# Patient Record
Sex: Female | Born: 1940 | Race: White | Hispanic: No | Marital: Married | Smoking: Never smoker
Health system: Southern US, Community
[De-identification: ages and names within clinical notes are randomized; demographics above are authoritative.]

## PROBLEM LIST (undated history)

## (undated) DIAGNOSIS — F410 Panic disorder [episodic paroxysmal anxiety] without agoraphobia: Secondary | ICD-10-CM

## (undated) DIAGNOSIS — Z87442 Personal history of urinary calculi: Secondary | ICD-10-CM

## (undated) DIAGNOSIS — F32A Depression, unspecified: Secondary | ICD-10-CM

## (undated) DIAGNOSIS — J40 Bronchitis, not specified as acute or chronic: Secondary | ICD-10-CM

## (undated) DIAGNOSIS — F329 Major depressive disorder, single episode, unspecified: Secondary | ICD-10-CM

## (undated) DIAGNOSIS — R112 Nausea with vomiting, unspecified: Secondary | ICD-10-CM

## (undated) DIAGNOSIS — M51369 Other intervertebral disc degeneration, lumbar region without mention of lumbar back pain or lower extremity pain: Secondary | ICD-10-CM

## (undated) DIAGNOSIS — R0600 Dyspnea, unspecified: Secondary | ICD-10-CM

## (undated) DIAGNOSIS — M797 Fibromyalgia: Secondary | ICD-10-CM

## (undated) DIAGNOSIS — D751 Secondary polycythemia: Secondary | ICD-10-CM

## (undated) DIAGNOSIS — M1990 Unspecified osteoarthritis, unspecified site: Secondary | ICD-10-CM

## (undated) DIAGNOSIS — I10 Essential (primary) hypertension: Secondary | ICD-10-CM

## (undated) DIAGNOSIS — Z9889 Other specified postprocedural states: Secondary | ICD-10-CM

## (undated) DIAGNOSIS — Z8489 Family history of other specified conditions: Secondary | ICD-10-CM

## (undated) DIAGNOSIS — T4145XA Adverse effect of unspecified anesthetic, initial encounter: Secondary | ICD-10-CM

## (undated) DIAGNOSIS — J449 Chronic obstructive pulmonary disease, unspecified: Secondary | ICD-10-CM

## (undated) DIAGNOSIS — J189 Pneumonia, unspecified organism: Secondary | ICD-10-CM

## (undated) DIAGNOSIS — M5136 Other intervertebral disc degeneration, lumbar region: Secondary | ICD-10-CM

## (undated) DIAGNOSIS — M4800 Spinal stenosis, site unspecified: Secondary | ICD-10-CM

## (undated) DIAGNOSIS — T8859XA Other complications of anesthesia, initial encounter: Secondary | ICD-10-CM

## (undated) DIAGNOSIS — E559 Vitamin D deficiency, unspecified: Secondary | ICD-10-CM

## (undated) HISTORY — PX: COLONOSCOPY: SHX174

## (undated) HISTORY — PX: APPENDECTOMY: SHX54

## (undated) HISTORY — PX: MULTIPLE TOOTH EXTRACTIONS: SHX2053

---

## 1973-06-18 HISTORY — PX: LUMBAR FUSION: SHX111

## 1976-06-18 HISTORY — PX: ABDOMINAL HYSTERECTOMY: SHX81

## 1978-06-18 HISTORY — PX: KNEE SURGERY: SHX244

## 2001-06-18 HISTORY — PX: ANKLE FRACTURE SURGERY: SHX122

## 2011-11-19 DIAGNOSIS — D751 Secondary polycythemia: Secondary | ICD-10-CM

## 2011-11-19 HISTORY — DX: Secondary polycythemia: D75.1

## 2013-04-14 ENCOUNTER — Other Ambulatory Visit: Payer: Self-pay | Admitting: Orthopedic Surgery

## 2013-04-14 DIAGNOSIS — M545 Low back pain, unspecified: Secondary | ICD-10-CM

## 2013-04-25 ENCOUNTER — Ambulatory Visit
Admission: RE | Admit: 2013-04-25 | Discharge: 2013-04-25 | Disposition: A | Discharge status: Home / Self Care | Payer: Medicare Other | Source: Ambulatory Visit | Attending: Orthopedic Surgery | Admitting: Orthopedic Surgery

## 2013-04-25 DIAGNOSIS — M545 Low back pain: Secondary | ICD-10-CM

## 2018-06-04 ENCOUNTER — Other Ambulatory Visit: Payer: Self-pay | Admitting: Neurosurgery

## 2018-06-20 ENCOUNTER — Other Ambulatory Visit: Payer: Self-pay | Admitting: Neurosurgery

## 2018-06-23 ENCOUNTER — Other Ambulatory Visit: Payer: Self-pay

## 2018-06-23 ENCOUNTER — Encounter (HOSPITAL_COMMUNITY): Payer: Self-pay | Admitting: *Deleted

## 2018-06-23 NOTE — Progress Notes (Signed)
PCP: Dr Arma Heading  Ms Held denies chest pain, she does experience shortness of breath at times, "COPD".  Patient states that she has allergies and feels that her head is stopped up, no fever, no sinus drainage and she is not taking mediations for it.   Ms Eickholt reports that she was [facility] first week of November with a UTI, "It was a super bug".  Patient does not remember which one. "I don't want to go to surgery with an infection, I hope they test me."  Ms Gelin states she does not have any symptoms of a UTI, "but they can crept up on me."  I explained to Ms Ragin if she comes in and is tested the am of surgery  That all the results may not be back before surgery.  I suggested that she go to her PCP and have urine text prior to Wednesday.  I called Dr. Fredrich Birks office and left a voice message for [place], [place]; I reported patient's concerns and what I suggested that she (patient ) do.  Ms Savoia does not see a cardiologist, she states that she had lots of cardiac test when she was hospitalized in November, in [facility].  I requested records. I also requested records from Dr. Prentice Docker office.

## 2018-06-24 ENCOUNTER — Encounter (HOSPITAL_COMMUNITY): Payer: Self-pay

## 2018-06-24 NOTE — Progress Notes (Signed)
Anesthesia Chart Review: SAME DAY WORKUP   Case:  [id number] Date/Time:  06/25/18 1240   Procedure:  LAMINECTOMY AND FORAMINOTOMY LUMBAR 2- LUMBAR 3, LUMBAR 3- LUMBAR 4 (N/A ) - LAMINECTOMY AND FORAMINOTOMY LUMBAR 2- LUMBAR 3, LUMBAR 3- LUMBAR 4   Anesthesia type:  General   Pre-op diagnosis:  SPINAL STENOSIS, LUMBAR GREGION WITH NEUROGENIC CLAUDICATION   Location:  [facility] OR ROOM 19 / [facility] OR   Surgeon:  Tressie StalkerJenkins, Jeffrey, MD      DISCUSSION: 78 yo female never smoker. Pertinent hx includes PONV, COPD, HTN, "Slow to wake up" from anesthesia, Chronic bronchitis.  Pt reported [place] [facility] [place] [facility] [place] [facility] [place] and had several cardiac tests while admitted, although she denies any cardiac history and does not see a cardiologist. Records requested and reviewed.  EKG 05/03/2018 showed sinus rhythm.  Rate 74.  Occasional PVCs.  Right bundle branch block.  Left anterior fascicular block.  Lateral infarct, age undetermined.  Echo 04/22/2018 showed EF 70 to 75%.  Moderate concentric hypertrophy with small cavity size.  LV filling pattern consistent with diastolic dysfunction.  Normal RV size and function.  Mild aortic stenosis.  She also had a Cardiolite stress test in 2016 for evaluation of heart murmur and hypertension.  The test showed normal left ventricular systolic function with an EF of 73%.  No clear definite scintigraphic evidence of ischemia or scar.  Anticipate she can proceed as planned barring acute status change.   VS: There were no vitals taken for this visit.  PROVIDERS: Arma HeadingIsernia, , MD is PCP   LABS: Will need DOS labs.  Labs Reviewed - No data to display   EKG: 05/03/2018 (outside record, copy on pt chart): sinus rhythm.  Rate 74.  Occasional PVCs.  Right bundle branch block.  Left anterior fascicular block.  Lateral infarct, age undetermined.  CV: TTE 04/22/2018 (outside record, copy on pt chart): Summary: Moderate concentric  hypertrophy with small cavity size and hyperdynamic function.  Ejection fraction 7075%.  Left ventricular filling pattern consistent with diastolic dysfunction.  Normal right ventricular size with normal function.  There is mild aortic stenosis   Mean gradient 10 mmHg.  Cardiolite stress 04/27/2015: Impression: 1.  Normal left ventricular systolic function with an ejection fraction 73%. 2.  No clear definite scintigraphic evidence of ischemia or scar.  Past Medical History:  Diagnosis Date  . Bronchitis    chronic  . Complication of anesthesia    "very slow to wake up."  . COPD (chronic obstructive pulmonary disease) (HCC)   . DDD (degenerative disc disease), lumbar   . Depression   . Dyspnea   . 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  . Family history of adverse reaction to anesthesia    Daughter to wake up, nausea  . Fibromyalgia   . History of kidney stones    passed  . Hypertension   . Osteoarthritis   . Panic attacks   . Pneumonia   . PONV (postoperative nausea and vomiting)   . Spinal stenosis   . Vitamin D deficiency     Past Surgical History:  Procedure Laterality Date  . ABDOMINAL HYSTERECTOMY  1978  . ANKLE FRACTURE SURGERY Left 2003  . APPENDECTOMY    . KNEE SURGERY Left 1980  . LUMBAR FUSION  1975    MEDICATIONS: . albuterol (PROVENTIL HFA;VENTOLIN HFA) 108 (90 Base) MCG/ACT inhaler  . ALPRAZolam (  XANAX) 0.5 MG tablet  . Cholecalciferol (VITAMIN D3) 1.25 MG (50000 UT) CAPS  . diltiazem (DILACOR XR) 180 MG 24 hr capsule  . FLUoxetine (PROZAC) 20 MG capsule  . polyethylene glycol (MIRALAX / GLYCOLAX) packet  . potassium chloride SA (K-DUR,KLOR-CON) 20 MEQ tablet  . senna-docusate (SENNA-PLUS) 8.6-50 MG tablet   No current facility-administered medications for this encounter.     Zannie Cove [facility] Short Stay Center/Anesthesiology Phone [phone] 06/24/2018 11:25 AM

## 2018-06-24 NOTE — Anesthesia Preprocedure Evaluation (Addendum)
Anesthesia Evaluation  Patient identified by MRN, date of birth, ID band Patient awake    Reviewed: Allergy & Precautions, NPO status , Patient's Chart, lab work & pertinent test results  History of Anesthesia Complications (+) PONV  Airway Mallampati: I  TM Distance: >3 FB Neck ROM: Full    Dental   Pulmonary    Pulmonary exam normal        Cardiovascular hypertension, Pt. on medications Normal cardiovascular exam     Neuro/Psych Anxiety Depression    GI/Hepatic   Endo/Other    Renal/GU      Musculoskeletal   Abdominal   Peds  Hematology   Anesthesia Other Findings   Reproductive/Obstetrics                            Anesthesia Physical Anesthesia Plan  ASA: II  Anesthesia Plan: General   Post-op Pain Management:    Induction:   PONV Risk Score and Plan: 3 and Midazolam, Ondansetron and Treatment may vary due to age or medical condition  Airway Management Planned: Oral ETT  Additional Equipment:   Intra-op Plan:   Post-operative Plan: Extubation in OR  Informed Consent: I have reviewed the patients History and Physical, chart, labs and discussed the procedure including the risks, benefits and alternatives for the proposed anesthesia with the patient or authorized representative who has indicated his/her understanding and acceptance.     Plan Discussed with: CRNA and Surgeon  Anesthesia Plan Comments: (See PAT note by Antionette Poles, PA-C )       Anesthesia Quick Evaluation

## 2018-06-25 ENCOUNTER — Encounter (HOSPITAL_COMMUNITY)
Admission: RE | Disposition: A | Discharge status: Home / Self Care | Payer: Self-pay | Source: Home / Self Care | Attending: Neurosurgery

## 2018-06-25 ENCOUNTER — Ambulatory Visit (HOSPITAL_COMMUNITY)
Admission: RE | Admit: 2018-06-25 | Discharge: 2018-06-27 | Disposition: A | Discharge status: Home / Self Care | Payer: Medicare Other | Attending: Neurosurgery | Admitting: Neurosurgery

## 2018-06-25 ENCOUNTER — Encounter (HOSPITAL_COMMUNITY): Payer: Self-pay

## 2018-06-25 ENCOUNTER — Other Ambulatory Visit: Payer: Self-pay

## 2018-06-25 ENCOUNTER — Ambulatory Visit (HOSPITAL_COMMUNITY): Payer: Medicare Other | Admitting: Certified Registered Nurse Anesthetist

## 2018-06-25 ENCOUNTER — Ambulatory Visit (HOSPITAL_COMMUNITY): Payer: Medicare Other

## 2018-06-25 ENCOUNTER — Ambulatory Visit (HOSPITAL_COMMUNITY): Payer: Medicare Other | Admitting: Physician Assistant

## 2018-06-25 DIAGNOSIS — Z90710 Acquired absence of both cervix and uterus: Secondary | ICD-10-CM | POA: Insufficient documentation

## 2018-06-25 DIAGNOSIS — Z888 Allergy status to other drugs, medicaments and biological substances status: Secondary | ICD-10-CM | POA: Insufficient documentation

## 2018-06-25 DIAGNOSIS — E559 Vitamin D deficiency, unspecified: Secondary | ICD-10-CM | POA: Diagnosis not present

## 2018-06-25 DIAGNOSIS — M1990 Unspecified osteoarthritis, unspecified site: Secondary | ICD-10-CM | POA: Insufficient documentation

## 2018-06-25 DIAGNOSIS — Z886 Allergy status to analgesic agent status: Secondary | ICD-10-CM | POA: Diagnosis not present

## 2018-06-25 DIAGNOSIS — Z87442 Personal history of urinary calculi: Secondary | ICD-10-CM | POA: Diagnosis not present

## 2018-06-25 DIAGNOSIS — M48062 Spinal stenosis, lumbar region with neurogenic claudication: Secondary | ICD-10-CM | POA: Diagnosis present

## 2018-06-25 DIAGNOSIS — J449 Chronic obstructive pulmonary disease, unspecified: Secondary | ICD-10-CM | POA: Diagnosis not present

## 2018-06-25 DIAGNOSIS — F410 Panic disorder [episodic paroxysmal anxiety] without agoraphobia: Secondary | ICD-10-CM | POA: Diagnosis not present

## 2018-06-25 DIAGNOSIS — M5116 Intervertebral disc disorders with radiculopathy, lumbar region: Secondary | ICD-10-CM | POA: Diagnosis present

## 2018-06-25 DIAGNOSIS — Z7951 Long term (current) use of inhaled steroids: Secondary | ICD-10-CM | POA: Diagnosis not present

## 2018-06-25 DIAGNOSIS — Z885 Allergy status to narcotic agent status: Secondary | ICD-10-CM | POA: Insufficient documentation

## 2018-06-25 DIAGNOSIS — R42 Dizziness and giddiness: Secondary | ICD-10-CM | POA: Insufficient documentation

## 2018-06-25 DIAGNOSIS — F419 Anxiety disorder, unspecified: Secondary | ICD-10-CM | POA: Diagnosis not present

## 2018-06-25 DIAGNOSIS — M797 Fibromyalgia: Secondary | ICD-10-CM | POA: Insufficient documentation

## 2018-06-25 DIAGNOSIS — Z419 Encounter for procedure for purposes other than remedying health state, unspecified: Secondary | ICD-10-CM

## 2018-06-25 DIAGNOSIS — I10 Essential (primary) hypertension: Secondary | ICD-10-CM | POA: Insufficient documentation

## 2018-06-25 DIAGNOSIS — Z79899 Other long term (current) drug therapy: Secondary | ICD-10-CM | POA: Diagnosis not present

## 2018-06-25 DIAGNOSIS — Z981 Arthrodesis status: Secondary | ICD-10-CM | POA: Diagnosis not present

## 2018-06-25 DIAGNOSIS — F329 Major depressive disorder, single episode, unspecified: Secondary | ICD-10-CM | POA: Insufficient documentation

## 2018-06-25 HISTORY — DX: Panic disorder (episodic paroxysmal anxiety): F41.0

## 2018-06-25 HISTORY — DX: Nausea with vomiting, unspecified: R11.2

## 2018-06-25 HISTORY — DX: Family history of other specified conditions: Z84.89

## 2018-06-25 HISTORY — DX: Other specified postprocedural states: Z98.890

## 2018-06-25 HISTORY — DX: Vitamin D deficiency, unspecified: E55.9

## 2018-06-25 HISTORY — DX: Adverse effect of unspecified anesthetic, initial encounter: T41.45XA

## 2018-06-25 HISTORY — DX: Secondary polycythemia: D75.1

## 2018-06-25 HISTORY — DX: Personal history of urinary calculi: Z87.442

## 2018-06-25 HISTORY — DX: Depression, unspecified: F32.A

## 2018-06-25 HISTORY — DX: Major depressive disorder, single episode, unspecified: F32.9

## 2018-06-25 HISTORY — DX: Chronic obstructive pulmonary disease, unspecified: J44.9

## 2018-06-25 HISTORY — DX: Other complications of anesthesia, initial encounter: T88.59XA

## 2018-06-25 HISTORY — DX: Spinal stenosis, site unspecified: M48.00

## 2018-06-25 HISTORY — DX: Pneumonia, unspecified organism: J18.9

## 2018-06-25 HISTORY — PX: LUMBAR LAMINECTOMY/DECOMPRESSION MICRODISCECTOMY: SHX5026

## 2018-06-25 HISTORY — DX: Other intervertebral disc degeneration, lumbar region without mention of lumbar back pain or lower extremity pain: M51.369

## 2018-06-25 HISTORY — DX: Unspecified osteoarthritis, unspecified site: M19.90

## 2018-06-25 HISTORY — DX: Fibromyalgia: M79.7

## 2018-06-25 HISTORY — DX: Essential (primary) hypertension: I10

## 2018-06-25 HISTORY — DX: Other intervertebral disc degeneration, lumbar region: M51.36

## 2018-06-25 HISTORY — DX: Bronchitis, not specified as acute or chronic: J40

## 2018-06-25 HISTORY — DX: Dyspnea, unspecified: R06.00

## 2018-06-25 LAB — BASIC METABOLIC PANEL
ANION GAP: 12 (ref 5–15)
BUN: 7 mg/dL — ABNORMAL LOW (ref 8–23)
CO2: 21 mmol/L — AB (ref 22–32)
Calcium: 10 mg/dL (ref 8.9–10.3)
Chloride: 107 mmol/L (ref 98–111)
Creatinine, Ser: 0.85 mg/dL (ref 0.44–1.00)
GFR calc non Af Amer: >60 mL/min (ref >60)
Glucose, Bld: 118 mg/dL — ABNORMAL HIGH (ref 70–99)
Potassium: 3.4 mmol/L — ABNORMAL LOW (ref 3.5–5.1)
Sodium: 140 mmol/L (ref 135–145)

## 2018-06-25 LAB — CBC
HEMATOCRIT: 49.1 % — AB (ref 36.0–46.0)
Hemoglobin: 15.4 g/dL — ABNORMAL HIGH (ref 12.0–15.0)
MCH: 28.5 pg (ref 26.0–34.0)
MCHC: 31.4 g/dL (ref 30.0–36.0)
MCV: 90.8 fL (ref 80.0–100.0)
Platelets: 348 10*3/uL (ref 150–400)
RBC: 5.41 MIL/uL — ABNORMAL HIGH (ref 3.87–5.11)
RDW: 13.7 % (ref 11.5–15.5)
WBC: 12.1 10*3/uL — ABNORMAL HIGH (ref 4.0–10.5)
nRBC: 0 % (ref 0.0–0.2)

## 2018-06-25 SURGERY — LUMBAR LAMINECTOMY/DECOMPRESSION MICRODISCECTOMY 2 LEVELS
Anesthesia: General

## 2018-06-25 MED ORDER — FENTANYL CITRATE (PF) 250 MCG/5ML IJ SOLN
INTRAMUSCULAR | Status: DC | PRN
  Administered 2018-06-25: 100 ug via INTRAVENOUS
  Administered 2018-06-25: 25 ug via INTRAVENOUS
  Administered 2018-06-25 (×2): 50 ug via INTRAVENOUS
  Administered 2018-06-25: 25 ug via INTRAVENOUS

## 2018-06-25 MED ORDER — SENNOSIDES-DOCUSATE SODIUM 8.6-50 MG PO TABS
2.0000 | ORAL_TABLET | Freq: Every day | ORAL | Status: DC
  Administered 2018-06-25 – 2018-06-26 (×2): 2 via ORAL
  Filled 2018-06-25 (×2): qty 2

## 2018-06-25 MED ORDER — BACITRACIN ZINC 500 UNIT/GM EX OINT
TOPICAL_OINTMENT | CUTANEOUS | Status: DC | PRN
  Administered 2018-06-25: 1 via TOPICAL

## 2018-06-25 MED ORDER — OXYCODONE HCL 5 MG PO TABS
ORAL_TABLET | ORAL | Status: AC
  Filled 2018-06-25: qty 1

## 2018-06-25 MED ORDER — LACTATED RINGERS IV SOLN
INTRAVENOUS | Status: DC
  Administered 2018-06-25 (×2): via INTRAVENOUS

## 2018-06-25 MED ORDER — HEMOSTATIC AGENTS (NO CHARGE) OPTIME
TOPICAL | Status: DC | PRN
  Administered 2018-06-25: 1 via TOPICAL

## 2018-06-25 MED ORDER — THROMBIN 5000 UNITS EX SOLR
CUTANEOUS | Status: AC
  Filled 2018-06-25: qty 5000

## 2018-06-25 MED ORDER — CHLORHEXIDINE GLUCONATE CLOTH 2 % EX PADS: 6.0000 | MEDICATED_PAD | Freq: Once | CUTANEOUS | Status: DC

## 2018-06-25 MED ORDER — BACITRACIN ZINC 500 UNIT/GM EX OINT
TOPICAL_OINTMENT | CUTANEOUS | Status: AC
  Filled 2018-06-25: qty 28.35

## 2018-06-25 MED ORDER — THROMBIN 5000 UNITS EX SOLR
OROMUCOSAL | Status: DC | PRN
  Administered 2018-06-25: 13:00:00 via TOPICAL

## 2018-06-25 MED ORDER — HYDROMORPHONE HCL 1 MG/ML IJ SOLN
0.5000 mg | INTRAMUSCULAR | Status: DC | PRN
  Administered 2018-06-25: 0.5 mg via INTRAVENOUS
  Filled 2018-06-25: qty 0.5

## 2018-06-25 MED ORDER — ONDANSETRON HCL 4 MG PO TABS: 4.0000 mg | ORAL_TABLET | Freq: Four times a day (QID) | ORAL | Status: DC | PRN

## 2018-06-25 MED ORDER — FENTANYL CITRATE (PF) 250 MCG/5ML IJ SOLN
INTRAMUSCULAR | Status: AC
  Filled 2018-06-25: qty 5

## 2018-06-25 MED ORDER — ONDANSETRON HCL 4 MG/2ML IJ SOLN: 4.0000 mg | Freq: Once | INTRAMUSCULAR | Status: DC | PRN

## 2018-06-25 MED ORDER — THROMBIN 5000 UNITS EX SOLR
CUTANEOUS | Status: DC | PRN
  Administered 2018-06-25 (×2): 5000 [IU] via TOPICAL

## 2018-06-25 MED ORDER — ZOLPIDEM TARTRATE 5 MG PO TABS: 5.0000 mg | ORAL_TABLET | Freq: Every evening | ORAL | Status: DC | PRN

## 2018-06-25 MED ORDER — HYDROMORPHONE HCL 1 MG/ML IJ SOLN
INTRAMUSCULAR | Status: AC
  Filled 2018-06-25: qty 1

## 2018-06-25 MED ORDER — OXYCODONE HCL 5 MG PO TABS
5.0000 mg | ORAL_TABLET | ORAL | Status: DC | PRN
  Administered 2018-06-25 – 2018-06-27 (×6): 5 mg via ORAL
  Filled 2018-06-25 (×5): qty 1

## 2018-06-25 MED ORDER — PROPOFOL 10 MG/ML IV BOLUS
INTRAVENOUS | Status: DC | PRN
  Administered 2018-06-25: 100 mg via INTRAVENOUS

## 2018-06-25 MED ORDER — PHENYLEPHRINE 40 MCG/ML (10ML) SYRINGE FOR IV PUSH (FOR BLOOD PRESSURE SUPPORT)
PREFILLED_SYRINGE | INTRAVENOUS | Status: AC
  Filled 2018-06-25: qty 10

## 2018-06-25 MED ORDER — FENTANYL CITRATE (PF) 100 MCG/2ML IJ SOLN
50.0000 ug | Freq: Once | INTRAMUSCULAR | Status: AC
  Administered 2018-06-25: 50 ug via INTRAVENOUS

## 2018-06-25 MED ORDER — HYDROMORPHONE HCL 1 MG/ML IJ SOLN
0.2500 mg | INTRAMUSCULAR | Status: DC | PRN
  Administered 2018-06-25 (×2): 0.5 mg via INTRAVENOUS

## 2018-06-25 MED ORDER — POLYETHYLENE GLYCOL 3350 17 G PO PACK
17.0000 g | PACK | Freq: Every day | ORAL | Status: DC
  Administered 2018-06-27: 17 g via ORAL
  Filled 2018-06-25 (×2): qty 1

## 2018-06-25 MED ORDER — LIDOCAINE 2% (20 MG/ML) 5 ML SYRINGE
INTRAMUSCULAR | Status: AC
  Filled 2018-06-25: qty 5

## 2018-06-25 MED ORDER — CEFAZOLIN SODIUM-DEXTROSE 2-4 GM/100ML-% IV SOLN
2.0000 g | Freq: Three times a day (TID) | INTRAVENOUS | Status: AC
  Administered 2018-06-25 – 2018-06-26 (×2): 2 g via INTRAVENOUS
  Filled 2018-06-25 (×2): qty 100

## 2018-06-25 MED ORDER — MENTHOL 3 MG MT LOZG: 1.0000 | LOZENGE | OROMUCOSAL | Status: DC | PRN

## 2018-06-25 MED ORDER — ROCURONIUM BROMIDE 50 MG/5ML IV SOSY
PREFILLED_SYRINGE | INTRAVENOUS | Status: AC
  Filled 2018-06-25: qty 10

## 2018-06-25 MED ORDER — PHENOL 1.4 % MT LIQD: 1.0000 | OROMUCOSAL | Status: DC | PRN

## 2018-06-25 MED ORDER — PHENYLEPHRINE HCL 10 MG/ML IJ SOLN
INTRAMUSCULAR | Status: DC | PRN
  Administered 2018-06-25 (×2): 80 ug via INTRAVENOUS

## 2018-06-25 MED ORDER — CYCLOBENZAPRINE HCL 10 MG PO TABS
10.0000 mg | ORAL_TABLET | Freq: Three times a day (TID) | ORAL | Status: DC | PRN
  Administered 2018-06-25 – 2018-06-26 (×3): 10 mg via ORAL
  Filled 2018-06-25 (×2): qty 1

## 2018-06-25 MED ORDER — ACETAMINOPHEN 325 MG PO TABS: 650.0000 mg | ORAL_TABLET | ORAL | Status: DC | PRN

## 2018-06-25 MED ORDER — MIDAZOLAM HCL 2 MG/2ML IJ SOLN
INTRAMUSCULAR | Status: DC | PRN
  Administered 2018-06-25: 2 mg via INTRAVENOUS

## 2018-06-25 MED ORDER — ALBUTEROL SULFATE (2.5 MG/3ML) 0.083% IN NEBU: 2.5000 mg | INHALATION_SOLUTION | Freq: Four times a day (QID) | RESPIRATORY_TRACT | Status: DC | PRN

## 2018-06-25 MED ORDER — 0.9 % SODIUM CHLORIDE (POUR BTL) OPTIME
TOPICAL | Status: DC | PRN
  Administered 2018-06-25: 1000 mL

## 2018-06-25 MED ORDER — DEXAMETHASONE SODIUM PHOSPHATE 10 MG/ML IJ SOLN
INTRAMUSCULAR | Status: DC | PRN
  Administered 2018-06-25: 10 mg via INTRAVENOUS

## 2018-06-25 MED ORDER — ONDANSETRON HCL 4 MG/2ML IJ SOLN: 4.0000 mg | Freq: Four times a day (QID) | INTRAMUSCULAR | Status: DC | PRN

## 2018-06-25 MED ORDER — SODIUM CHLORIDE 0.9% FLUSH: 3.0000 mL | INTRAVENOUS | Status: DC | PRN

## 2018-06-25 MED ORDER — CYCLOBENZAPRINE HCL 10 MG PO TABS
ORAL_TABLET | ORAL | Status: AC
  Filled 2018-06-25: qty 1

## 2018-06-25 MED ORDER — LIDOCAINE 2% (20 MG/ML) 5 ML SYRINGE
INTRAMUSCULAR | Status: DC | PRN
  Administered 2018-06-25: 100 mg via INTRAVENOUS

## 2018-06-25 MED ORDER — SODIUM CHLORIDE 0.9 % IV SOLN: 250.0000 mL | INTRAVENOUS | Status: DC

## 2018-06-25 MED ORDER — THROMBIN 5000 UNITS EX SOLR
CUTANEOUS | Status: AC
  Filled 2018-06-25: qty 10000

## 2018-06-25 MED ORDER — DOCUSATE SODIUM 100 MG PO CAPS
100.0000 mg | ORAL_CAPSULE | Freq: Two times a day (BID) | ORAL | Status: DC
  Administered 2018-06-26 – 2018-06-27 (×3): 100 mg via ORAL
  Filled 2018-06-25 (×3): qty 1

## 2018-06-25 MED ORDER — DILTIAZEM HCL ER COATED BEADS 180 MG PO CP24
180.0000 mg | ORAL_CAPSULE | Freq: Every day | ORAL | Status: DC
  Administered 2018-06-26 – 2018-06-27 (×2): 180 mg via ORAL
  Filled 2018-06-25 (×3): qty 1

## 2018-06-25 MED ORDER — ALBUMIN HUMAN 5 % IV SOLN
INTRAVENOUS | Status: DC | PRN
  Administered 2018-06-25: 15:00:00 via INTRAVENOUS

## 2018-06-25 MED ORDER — ROCURONIUM BROMIDE 50 MG/5ML IV SOSY
PREFILLED_SYRINGE | INTRAVENOUS | Status: DC | PRN
  Administered 2018-06-25: 50 mg via INTRAVENOUS
  Administered 2018-06-25: 10 mg via INTRAVENOUS

## 2018-06-25 MED ORDER — BISACODYL 10 MG RE SUPP: 10.0000 mg | Freq: Every day | RECTAL | Status: DC | PRN

## 2018-06-25 MED ORDER — MIDAZOLAM HCL 2 MG/2ML IJ SOLN
INTRAMUSCULAR | Status: AC
  Filled 2018-06-25: qty 2

## 2018-06-25 MED ORDER — FLUOXETINE HCL 20 MG PO CAPS
20.0000 mg | ORAL_CAPSULE | Freq: Every day | ORAL | Status: DC
  Administered 2018-06-26 – 2018-06-27 (×2): 20 mg via ORAL
  Filled 2018-06-25 (×2): qty 1

## 2018-06-25 MED ORDER — ONDANSETRON HCL 4 MG/2ML IJ SOLN
INTRAMUSCULAR | Status: DC | PRN
  Administered 2018-06-25: 4 mg via INTRAVENOUS

## 2018-06-25 MED ORDER — ALPRAZOLAM 0.25 MG PO TABS
0.2500 mg | ORAL_TABLET | Freq: Two times a day (BID) | ORAL | Status: DC
  Administered 2018-06-25 – 2018-06-27 (×3): 0.25 mg via ORAL
  Filled 2018-06-25 (×3): qty 1

## 2018-06-25 MED ORDER — BUPIVACAINE-EPINEPHRINE (PF) 0.25% -1:200000 IJ SOLN
INTRAMUSCULAR | Status: AC
  Filled 2018-06-25: qty 30

## 2018-06-25 MED ORDER — OXYCODONE HCL 5 MG PO TABS
10.0000 mg | ORAL_TABLET | ORAL | Status: DC | PRN
  Administered 2018-06-25 – 2018-06-27 (×4): 10 mg via ORAL
  Filled 2018-06-25 (×4): qty 2

## 2018-06-25 MED ORDER — SODIUM CHLORIDE 0.9% FLUSH: 3.0000 mL | Freq: Two times a day (BID) | INTRAVENOUS | Status: DC

## 2018-06-25 MED ORDER — MEPERIDINE HCL 50 MG/ML IJ SOLN: 6.2500 mg | INTRAMUSCULAR | Status: DC | PRN

## 2018-06-25 MED ORDER — DEXAMETHASONE SODIUM PHOSPHATE 10 MG/ML IJ SOLN
INTRAMUSCULAR | Status: AC
  Filled 2018-06-25: qty 1

## 2018-06-25 MED ORDER — CEFAZOLIN SODIUM-DEXTROSE 2-4 GM/100ML-% IV SOLN
2.0000 g | INTRAVENOUS | Status: AC
  Administered 2018-06-25: 2 g via INTRAVENOUS
  Filled 2018-06-25: qty 100

## 2018-06-25 MED ORDER — ONDANSETRON HCL 4 MG/2ML IJ SOLN
INTRAMUSCULAR | Status: AC
  Filled 2018-06-25: qty 2

## 2018-06-25 MED ORDER — SODIUM CHLORIDE 0.9 % IV SOLN
INTRAVENOUS | Status: DC | PRN
  Administered 2018-06-25: 13:00:00

## 2018-06-25 MED ORDER — ACETAMINOPHEN 500 MG PO TABS
1000.0000 mg | ORAL_TABLET | Freq: Four times a day (QID) | ORAL | Status: AC
  Administered 2018-06-25 – 2018-06-26 (×3): 1000 mg via ORAL
  Filled 2018-06-25 (×4): qty 2

## 2018-06-25 MED ORDER — BUPIVACAINE-EPINEPHRINE (PF) 0.25% -1:200000 IJ SOLN
INTRAMUSCULAR | Status: DC | PRN
  Administered 2018-06-25: 10 mL via PERINEURAL

## 2018-06-25 MED ORDER — POTASSIUM CHLORIDE CRYS ER 20 MEQ PO TBCR
20.0000 meq | EXTENDED_RELEASE_TABLET | Freq: Every day | ORAL | Status: DC
  Administered 2018-06-26: 20 meq via ORAL
  Filled 2018-06-25: qty 1

## 2018-06-25 MED ORDER — FENTANYL CITRATE (PF) 100 MCG/2ML IJ SOLN
INTRAMUSCULAR | Status: AC
  Filled 2018-06-25: qty 2

## 2018-06-25 MED ORDER — SODIUM CHLORIDE 0.9 % IV SOLN
INTRAVENOUS | Status: DC | PRN
  Administered 2018-06-25: 40 ug/min via INTRAVENOUS

## 2018-06-25 MED ORDER — ACETAMINOPHEN 650 MG RE SUPP: 650.0000 mg | RECTAL | Status: DC | PRN

## 2018-06-25 MED ORDER — SUGAMMADEX SODIUM 200 MG/2ML IV SOLN
INTRAVENOUS | Status: DC | PRN
  Administered 2018-06-25: 167.8 mg via INTRAVENOUS

## 2018-06-25 SURGICAL SUPPLY — 53 items
BAG DECANTER FOR FLEXI CONT (MISCELLANEOUS) ×3 IMPLANT
BENZOIN TINCTURE PRP APPL 2/3 (GAUZE/BANDAGES/DRESSINGS) ×3 IMPLANT
BLADE CLIPPER SURG (BLADE) IMPLANT
BUR MATCHSTICK NEURO 3.0 LAGG (BURR) ×3 IMPLANT
BUR PRECISION FLUTE 6.0 (BURR) ×3 IMPLANT
CANISTER SUCT 3000ML PPV (MISCELLANEOUS) ×3 IMPLANT
CARTRIDGE OIL MAESTRO DRILL (MISCELLANEOUS) ×1 IMPLANT
CLOSURE WOUND 1/2 X4 (GAUZE/BANDAGES/DRESSINGS) ×1
COVER WAND RF STERILE (DRAPES) ×3 IMPLANT
DIFFUSER DRILL AIR PNEUMATIC (MISCELLANEOUS) ×3 IMPLANT
DRAPE LAPAROTOMY 100X72X124 (DRAPES) ×3 IMPLANT
DRAPE MICROSCOPE LEICA (MISCELLANEOUS) ×3 IMPLANT
DRAPE POUCH INSTRU U-SHP 10X18 (DRAPES) ×3 IMPLANT
DRAPE SURG 17X23 STRL (DRAPES) ×12 IMPLANT
DRSG OPSITE POSTOP 4X6 (GAUZE/BANDAGES/DRESSINGS) ×2 IMPLANT
ELECT BLADE 4.0 EZ CLEAN MEGAD (MISCELLANEOUS) ×3
ELECT REM PT RETURN 9FT ADLT (ELECTROSURGICAL) ×3
ELECTRODE BLDE 4.0 EZ CLN MEGD (MISCELLANEOUS) ×1 IMPLANT
ELECTRODE REM PT RTRN 9FT ADLT (ELECTROSURGICAL) ×1 IMPLANT
GAUZE 4X4 16PLY RFD (DISPOSABLE) IMPLANT
GAUZE SPONGE 4X4 12PLY STRL (GAUZE/BANDAGES/DRESSINGS) ×3 IMPLANT
GLOVE BIO SURGEON STRL SZ 6.5 (GLOVE) ×1 IMPLANT
GLOVE BIO SURGEON STRL SZ8 (GLOVE) ×3 IMPLANT
GLOVE BIO SURGEON STRL SZ8.5 (GLOVE) ×3 IMPLANT
GLOVE BIO SURGEONS STRL SZ 6.5 (GLOVE) ×1
GLOVE EXAM NITRILE XL STR (GLOVE) IMPLANT
GLOVE SURG SS PI 6.5 STRL IVOR (GLOVE) ×2 IMPLANT
GLOVE SURG SS PI 7.0 STRL IVOR (GLOVE) ×6 IMPLANT
GOWN STRL REUS W/ TWL LRG LVL3 (GOWN DISPOSABLE) IMPLANT
GOWN STRL REUS W/ TWL XL LVL3 (GOWN DISPOSABLE) ×1 IMPLANT
GOWN STRL REUS W/TWL 2XL LVL3 (GOWN DISPOSABLE) IMPLANT
GOWN STRL REUS W/TWL LRG LVL3 (GOWN DISPOSABLE) ×2
GOWN STRL REUS W/TWL XL LVL3 (GOWN DISPOSABLE) ×4
HEMOSTAT POWDER KIT SURGIFOAM (HEMOSTASIS) ×2 IMPLANT
KIT BASIN OR (CUSTOM PROCEDURE TRAY) ×3 IMPLANT
KIT TURNOVER KIT B (KITS) ×3 IMPLANT
NDL HYPO 21X1.5 SAFETY (NEEDLE) IMPLANT
NEEDLE HYPO 21X1.5 SAFETY (NEEDLE) IMPLANT
NEEDLE HYPO 22GX1.5 SAFETY (NEEDLE) ×3 IMPLANT
NS IRRIG 1000ML POUR BTL (IV SOLUTION) ×3 IMPLANT
OIL CARTRIDGE MAESTRO DRILL (MISCELLANEOUS) ×3
PACK LAMINECTOMY NEURO (CUSTOM PROCEDURE TRAY) ×3 IMPLANT
PAD ARMBOARD 7.5X6 YLW CONV (MISCELLANEOUS) ×9 IMPLANT
PATTIES SURGICAL .5 X1 (DISPOSABLE) IMPLANT
RUBBERBAND STERILE (MISCELLANEOUS) ×6 IMPLANT
SPONGE SURGIFOAM ABS GEL SZ50 (HEMOSTASIS) ×3 IMPLANT
STRIP CLOSURE SKIN 1/2X4 (GAUZE/BANDAGES/DRESSINGS) ×2 IMPLANT
SUT VIC AB 1 CT1 18XBRD ANBCTR (SUTURE) ×2 IMPLANT
SUT VIC AB 1 CT1 8-18 (SUTURE) ×4
SUT VIC AB 2-0 CP2 18 (SUTURE) ×6 IMPLANT
TOWEL GREEN STERILE (TOWEL DISPOSABLE) ×3 IMPLANT
TOWEL GREEN STERILE FF (TOWEL DISPOSABLE) ×3 IMPLANT
WATER STERILE IRR 1000ML POUR (IV SOLUTION) ×3 IMPLANT

## 2018-06-25 NOTE — H&P (Signed)
Subjective: The patient is a 78 year old white female who has complained of back and leg pain consistent with neurogenic claudication.  She has failed medical management and was worked up with a lumbar MRI and lumbar x-rays.  This demonstrated the patient had spinal stenosis at L2-3 and L3-4  And a previous laminectomy at L4-5.  I discussed the various treatment options with her.  She has decided to proceed with surgery.  Past Medical History:  Diagnosis Date  . Bronchitis    chronic  . Complication of anesthesia    "very slow to wake up."  . COPD (chronic obstructive pulmonary disease) (HCC)   . DDD (degenerative disc disease), lumbar   . Depression   . Dyspnea   . 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  . Family history of adverse reaction to anesthesia    Daughter to wake up, nausea  . Fibromyalgia   . History of kidney stones    passed  . Hypertension   . Osteoarthritis   . Panic attacks   . Pneumonia   . PONV (postoperative nausea and vomiting)   . Spinal stenosis   . Vitamin D deficiency     Past Surgical History:  Procedure Laterality Date  . ABDOMINAL HYSTERECTOMY  1978  . ANKLE FRACTURE SURGERY Left 2003  . APPENDECTOMY    . KNEE SURGERY Left 1980  . LUMBAR FUSION  1975    Allergies  Allergen Reactions  . Naprosyn [Naproxen] Anaphylaxis, Shortness Of Breath and Swelling  . Nsaids Other (See Comments)    "swells"  . Valium [Diazepam] Other (See Comments)    "makes me Hyper"  . Codeine Nausea And Vomiting  . Morphine And Related Other (See Comments)    "Makes me feel stupid"    Social History   Tobacco Use  . Smoking status: Never Smoker  . Smokeless tobacco: Never Used  Substance Use Topics  . Alcohol use: Never    Frequency: Never    History reviewed. No pertinent family history. Prior to Admission medications   Medication Sig Start Date End Date Taking? Authorizing Provider   acetaminophen (TYLENOL) 500 MG tablet Take 500 mg by mouth every 6 (six) hours as needed for mild pain or moderate pain.   Yes [provider]  ALPRAZolam Prudy Feeler) 0.5 MG tablet Take 0.25 mg by mouth 2 (two) times daily.   Yes [provider]  Cholecalciferol (VITAMIN D3) 1.25 MG (50000 UT) CAPS Take 50,000 Units by mouth 2 (two) times a week.   Yes [provider]  diltiazem (DILACOR XR) 180 MG 24 hr capsule Take 180 mg by mouth daily.   Yes [provider]  FLUoxetine (PROZAC) 20 MG capsule Take 20 mg by mouth daily.   Yes [provider]  polyethylene glycol (MIRALAX / GLYCOLAX) packet Take 17 g by mouth daily.   Yes [provider]  potassium chloride SA (K-DUR,KLOR-CON) 20 MEQ tablet Take 20 mEq by mouth daily.   Yes [provider]  senna-docusate (SENNA-PLUS) 8.6-50 MG tablet Take 2 tablets by mouth at bedtime.   Yes [provider]  albuterol (PROVENTIL HFA;VENTOLIN HFA) 108 (90 Base) MCG/ACT inhaler Inhale 2 puffs into the lungs every 6 (six) hours as needed for wheezing or shortness of breath.    [provider]     Review of Systems  Positive ROS: As above  All other systems have been reviewed and were otherwise  negative with the exception of those mentioned in the HPI and as above.  Objective: Vital signs in last 24 hours: Temp:  [97.8 F (36.6 C)] 97.8 F (36.6 C) (01/08 1107) Pulse Rate:  [113] 113 (01/08 1107) Resp:  [20] 20 (01/08 1107) BP: (190)/(92) 190/92 (01/08 1107) SpO2:  [98 %] 98 % (01/08 1107) Weight:  [83.9 kg] 83.9 kg (01/08 1108) Estimated body mass index is 30.79 kg/m as calculated from the following:   Height as of this encounter: 5\' 5"  (1.651 m).   Weight as of this encounter: 83.9 kg.   General Appearance: Alert Head: Normocephalic, without obvious abnormality, atraumatic Eyes: PERRL, conjunctiva/corneas clear, EOM's intact,    Ears: Normal  Throat: Normal  Neck:  Supple, Back: Her lumbar incision is well-healed. Lungs: Clear to auscultation bilaterally, respirations unlabored Heart: Regular rate and rhythm, no murmur, rub or gallop Abdomen: Soft, non-tender Extremities: Extremities normal, atraumatic, no cyanosis or edema Skin: unremarkable  NEUROLOGIC:   Mental status: alert and oriented,Motor Exam - grossly normal Sensory Exam - grossly normal Reflexes:  Coordination - grossly normal Gait - grossly normal Balance - grossly normal Cranial Nerves: I: smell Not tested  II: visual acuity  OS: Normal  OD: Normal   II: visual fields Full to confrontation  II: pupils Equal, round, reactive to light  III,VII: ptosis None  III,IV,VI: extraocular muscles  Full ROM  V: mastication Normal  V: facial light touch sensation  Normal  V,VII: corneal reflex  Present  VII: facial muscle function - upper  Normal  VII: facial muscle function - lower Normal  VIII: hearing Not tested  IX: soft palate elevation  Normal  IX,X: gag reflex Present  XI: trapezius strength  5/5  XI: sternocleidomastoid strength 5/5  XI: neck flexion strength  5/5  XII: tongue strength  Normal    Data Review No results found for: WBC, HGB, HCT, MCV, PLT No results found for: NA, K, CL, CO2, BUN, CREATININE, GLUCOSE No results found for: INR, PROTIME  Assessment/Plan: L2-3 and L3-4 spinal stenosis, lumbago, lumbar radiculopathy, neurogenic claudication: I have discussed the situation with the patient.  I have reviewed her imaging studies with her and pointed out the abnormalities.  We have discussed the various treatment options including surgery.  I have described the surgical treatment option of an L2-3 and L3-4 laminectomy/laminotomy/foraminotomy.  I have shown her surgical models.  I have given her a surgical pamphlet.  We have discussed the risks, benefits, alternatives, expected postoperative course, and likelihood of achieving our goals with surgery.  I have answered all  her questions.  She has decided to proceed with surgery.   Cristi Loron 06/25/2018 12:07 PM

## 2018-06-25 NOTE — Progress Notes (Signed)
Subjective: The patient is somnolent but easily arousable.  She is in no apparent distress.  Objective: Vital signs in last 24 hours: Temp:  [97.8 F (36.6 C)] 97.8 F (36.6 C) (01/08 1107) Pulse Rate:  [113] 113 (01/08 1107) Resp:  [20] 20 (01/08 1107) BP: (190)/(92) 190/92 (01/08 1107) SpO2:  [98 %] 98 % (01/08 1107) Weight:  [83.9 kg] 83.9 kg (01/08 1108) Estimated body mass index is 30.79 kg/m as calculated from the following:   Height as of this encounter: 5\' 5"  (1.651 m).   Weight as of this encounter: 83.9 kg.   Intake/Output from previous day: No intake/output data recorded. Intake/Output this shift: Total I/O In: 1450 [I.V.:1200; IV Piggyback:250] Out: 250 [Blood:250]  Physical exam the patient is somnolent but arousable.  She is moving her lower extremities well.  Lab Results: Recent Labs    06/25/18 1145  WBC 12.1*  HGB 15.4*  HCT 49.1*  PLT 348   BMET Recent Labs    06/25/18 1145  NA 140  K 3.4*  CL 107  CO2 21*  GLUCOSE 118*  BUN 7*  CREATININE 0.85  CALCIUM 10.0    Studies/Results: No results found.  Assessment/Plan: The patient is doing well.  I spoke with her family.  LOS: 0 days     Cristi Loron 06/25/2018, 3:39 PM

## 2018-06-25 NOTE — Anesthesia Postprocedure Evaluation (Signed)
Anesthesia Post Note  Patient: Alleah Matis  Procedure(s) Performed: LAMINECTOMY AND FORAMINOTOMY LUMBAR TWO- LUMBAR THREE, LUMBAR FOUR (N/A )     Patient location during evaluation: PACU Anesthesia Type: General Level of consciousness: awake and alert Pain management: pain level controlled Vital Signs Assessment: post-procedure vital signs reviewed and stable Respiratory status: spontaneous breathing, nonlabored ventilation, respiratory function stable and patient connected to nasal cannula oxygen Cardiovascular status: blood pressure returned to baseline and stable Postop Assessment: no apparent nausea or vomiting Anesthetic complications: no    Last Vitals:  Vitals:   06/25/18 1545 06/25/18 1550  BP:  (!) 148/86  Pulse: 97 (!) 102  Resp: 14 13  Temp:    SpO2: 96% 95%    Last Pain:  Vitals:   06/25/18 1545  TempSrc:   PainSc: 5                  Mililani Murthy DAVID

## 2018-06-25 NOTE — Op Note (Signed)
Brief history: The patient is a 78 year old white female whose had previous back surgery by another physician.  She has developed recurrent back and right greater than left leg pain.  She has failed medical management.  She was worked up with a lumbar MRI which demonstrated spinal stenosis at L2-3 and L3-4 and a herniated disc at L3-4.  I discussed the various treatment options with the patient including surgery.  She has decided proceed with a lumbar laminectomy and discectomy.  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 Delma Officer  Asst.: Hildred Priest nurse practitioner  Anesthesia: Gen. endotracheal  Estimated blood loss: 75 cc  Drains: None  Complications: None  Description of procedure: The patient was brought to the operating room by the anesthesia team. General endotracheal anesthesia was induced. The patient was turned to the prone position on the Wilson frame. The patient's lumbosacral region was then prepared with Betadine scrub and Betadine solution. Sterile drapes were applied.  I then injected the area to be incised with Marcaine with epinephrine solution. I then used a scalpel to make a linear midline incision over the L2-3 and L3-4 intervertebral disc space. I then used electrocautery to perform a bilateral subperiosteal dissection exposing the spinous process and lamina of L2, L3 and L4. We obtained intraoperative radiograph to confirm our location. I then inserted the [facility] retractor for exposure.  We began the decompression by using a Leksell rongeur to remove the L2 and L3 spinous process and the cephalad aspect of the L4 spinous process.  We then brought the operative microscope into the field. Under its  magnification and illumination we completed the microdissection. I used a high-speed drill to perform  bilateral laminotomies at L2-3 and L3-4. I then used a Kerrison punches to widen the laminotomy and removed the ligamentum flavum at L2-3 and L3-4. We then used microdissection to free up the thecal sac and the bilateral L3 and L4 nerve root from the epidural tissue. I then used a Kerrison punch to perform a foraminotomy at about the bilateral L3 and L4 nerve root. We then using the nerve root retractor to gently retract the thecal sac and the right L4 nerve root medially. This exposed the herniated disc at L3-4 on the right. We remove the herniated disks with a pituitary forceps and performed a partial intervertebral discectomy.  I then palpated along the ventral surface of the thecal sac and along exit route of the bilateral L3 and L4 nerve root and noted that the neural structures were well decompressed. This completed the decompression.  We then obtained hemostasis using bipolar electrocautery. We irrigated the wound out with bacitracin solution. We then removed the retractor. We then reapproximated the patient's thoracolumbar fascia with interrupted #1 Vicryl suture. We then reapproximated the patient's subcutaneous tissue with interrupted 2-0 Vicryl suture. We then reapproximated patient's skin with Steri-Strips and benzoin. The was then coated with bacitracin ointment. The drapes were removed. The patient was subsequently returned to the supine position where they were extubated by the anesthesia team. The patient was then transported to the postanesthesia care unit in stable condition. All sponge instrument and needle counts were reportedly correct at the end of this case.

## 2018-06-25 NOTE — H&P (Signed)
Subjective: The patient is a 78 year old white female whose had previous back surgery.  She has developed recurrent back and right greater than left leg pain.  She has failed medical management.  She was worked up with a lumbar MRI which demonstrated spinal stenosis at L2-3 and L3-4.  I discussed the various treatment options with her.  She has decided proceed with an L2-3 and L3-4 laminectomy/laminotomy/foraminotomies.  Past Medical History:  Diagnosis Date  . Bronchitis    chronic  . Complication of anesthesia    "very slow to wake up."  . COPD (chronic obstructive pulmonary disease) (HCC)   . DDD (degenerative disc disease), lumbar   . Depression   . Dyspnea   . 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  . Family history of adverse reaction to anesthesia    Daughter to wake up, nausea  . Fibromyalgia   . History of kidney stones    passed  . Hypertension   . Osteoarthritis   . Panic attacks   . Pneumonia   . PONV (postoperative nausea and vomiting)   . Spinal stenosis   . Vitamin D deficiency     Past Surgical History:  Procedure Laterality Date  . ABDOMINAL HYSTERECTOMY  1978  . ANKLE FRACTURE SURGERY Left 2003  . APPENDECTOMY    . KNEE SURGERY Left 1980  . LUMBAR FUSION  1975    Allergies  Allergen Reactions  . Naprosyn [Naproxen] Anaphylaxis, Shortness Of Breath and Swelling  . Nsaids Other (See Comments)    "swells"  . Valium [Diazepam] Other (See Comments)    "makes me Hyper"  . Codeine Nausea And Vomiting  . Morphine And Related Other (See Comments)    "Makes me feel stupid"    Social History   Tobacco Use  . Smoking status: Never Smoker  . Smokeless tobacco: Never Used  Substance Use Topics  . Alcohol use: Never    Frequency: Never    History reviewed. No pertinent family history. Prior to Admission medications   Medication Sig Start Date End Date Taking? Authorizing Provider   acetaminophen (TYLENOL) 500 MG tablet Take 500 mg by mouth every 6 (six) hours as needed for mild pain or moderate pain.   Yes [provider]  ALPRAZolam Prudy Feeler) 0.5 MG tablet Take 0.25 mg by mouth 2 (two) times daily.   Yes [provider]  Cholecalciferol (VITAMIN D3) 1.25 MG (50000 UT) CAPS Take 50,000 Units by mouth 2 (two) times a week.   Yes [provider]  diltiazem (DILACOR XR) 180 MG 24 hr capsule Take 180 mg by mouth daily.   Yes [provider]  FLUoxetine (PROZAC) 20 MG capsule Take 20 mg by mouth daily.   Yes [provider]  polyethylene glycol (MIRALAX / GLYCOLAX) packet Take 17 g by mouth daily.   Yes [provider]  potassium chloride SA (K-DUR,KLOR-CON) 20 MEQ tablet Take 20 mEq by mouth daily.   Yes [provider]  senna-docusate (SENNA-PLUS) 8.6-50 MG tablet Take 2 tablets by mouth at bedtime.   Yes [provider]  albuterol (PROVENTIL HFA;VENTOLIN HFA) 108 (90 Base) MCG/ACT inhaler Inhale 2 puffs into the lungs every 6 (six) hours as needed for wheezing or shortness of breath.    [provider]     Review of Systems  Positive ROS: As above  All other systems have been reviewed and were otherwise negative with the exception  of those mentioned in the HPI and as above.  Objective: Vital signs in last 24 hours: Temp:  [97.8 F (36.6 C)] 97.8 F (36.6 C) (01/08 1107) Pulse Rate:  [113] 113 (01/08 1107) Resp:  [20] 20 (01/08 1107) BP: (190)/(92) 190/92 (01/08 1107) SpO2:  [98 %] 98 % (01/08 1107) Weight:  [83.9 kg] 83.9 kg (01/08 1108) Estimated body mass index is 30.79 kg/m as calculated from the following:   Height as of this encounter: 5\' 5"  (1.651 m).   Weight as of this encounter: 83.9 kg.   General Appearance: Alert Head: Normocephalic, without obvious abnormality, atraumatic Eyes: PERRL, conjunctiva/corneas clear, EOM's intact,    Ears: Normal  Throat: Normal  Neck:  Supple, Back: The patient's lumbar incision is well-healed. Lungs: Clear to auscultation bilaterally, respirations unlabored Heart: Regular rate and rhythm, no murmur, rub or gallop Abdomen: Soft, non-tender Extremities: Extremities normal, atraumatic, no cyanosis or edema Skin: unremarkable  NEUROLOGIC:   Mental status: alert and oriented,Motor Exam - grossly normal Sensory Exam - grossly normal Reflexes:  Coordination - grossly normal Gait - grossly normal Balance - grossly normal Cranial Nerves: I: smell Not tested  II: visual acuity  OS: Normal  OD: Normal   II: visual fields Full to confrontation  II: pupils Equal, round, reactive to light  III,VII: ptosis None  III,IV,VI: extraocular muscles  Full ROM  V: mastication Normal  V: facial light touch sensation  Normal  V,VII: corneal reflex  Present  VII: facial muscle function - upper  Normal  VII: facial muscle function - lower Normal  VIII: hearing Not tested  IX: soft palate elevation  Normal  IX,X: gag reflex Present  XI: trapezius strength  5/5  XI: sternocleidomastoid strength 5/5  XI: neck flexion strength  5/5  XII: tongue strength  Normal    Data Review No results found for: WBC, HGB, HCT, MCV, PLT No results found for: NA, K, CL, CO2, BUN, CREATININE, GLUCOSE No results found for: INR, PROTIME  Assessment/Plan: 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  I reviewed her imaging studies with her and pointed out the abnormalities.  We have discussed the various treatment options including surgery.  I have described the surgical treatment option of an L2-3 and L3-4 laminectomy/laminotomy/foraminotomies.  I have shown her surgical models.  I have given her a surgical pamphlet.  We have discussed the risks, benefits, alternatives, expected postoperative course, and likelihood of achieving our goals with surgery.  I have answered all her questions.   She has decided to proceed with surgery.   Cristi LoronJeffrey D Elige Shouse 06/25/2018 12:23 PM

## 2018-06-25 NOTE — Anesthesia Procedure Notes (Signed)
Procedure Name: Intubation Date/Time: 06/25/2018 2:56 PM Performed by: Modena Morrow, CRNA Pre-anesthesia Checklist: Patient identified, Emergency Drugs available, Suction available, Patient being monitored and Timeout performed Patient Re-evaluated:Patient Re-evaluated prior to induction Oxygen Delivery Method: Circle system utilized Preoxygenation: Pre-oxygenation with 100% oxygen Induction Type: IV induction Ventilation: Mask ventilation without difficulty Grade View: Grade I Tube type: Oral Tube size: 7.0 mm Number of attempts: 1 Airway Equipment and Method: Stylet Secured at: 21 cm Tube secured with: Tape Dental Injury: Teeth and Oropharynx as per pre-operative assessment

## 2018-06-25 NOTE — Transfer of Care (Signed)
Immediate Anesthesia Transfer of Care Note  Patient: Erica Sanders  Procedure(s) Performed: LAMINECTOMY AND FORAMINOTOMY LUMBAR TWO- LUMBAR THREE, LUMBAR FOUR (N/A )  Patient Location: PACU  Anesthesia Type:General  Level of Consciousness: patient cooperative  Airway & Oxygen Therapy: Patient Spontanous Breathing and Patient connected to face mask oxygen  Post-op Assessment: Report given to RN and Post -op Vital signs reviewed and stable  Post vital signs: Reviewed and stable  Last Vitals:  Vitals Value Taken Time  BP 156/65 06/25/2018  3:19 PM  Temp    Pulse 102 06/25/2018  3:20 PM  Resp 16 06/25/2018  3:20 PM  SpO2 97 % 06/25/2018  3:20 PM  Vitals shown include unvalidated device data.  Last Pain:  Vitals:   06/25/18 1215  TempSrc:   PainSc: 5       Patients Stated Pain Goal: 3 (06/25/18 1148)  Complications: No apparent anesthesia complications

## 2018-06-26 ENCOUNTER — Encounter (HOSPITAL_COMMUNITY): Payer: Self-pay | Admitting: Neurosurgery

## 2018-06-26 DIAGNOSIS — M5116 Intervertebral disc disorders with radiculopathy, lumbar region: Secondary | ICD-10-CM | POA: Diagnosis not present

## 2018-06-26 NOTE — Progress Notes (Signed)
Subjective: The patient is alert and pleasant.  Her back is a bit sore as expected.  She complains mainly of dizziness.  This has been a chronic issue for her and has been extensively worked up at [facility].  Objective: Vital signs in last 24 hours: Temp:  [97.5 F (36.4 C)-99.1 F (37.3 C)] 98.2 F (36.8 C) (01/09 0833) Pulse Rate:  [79-113] 106 (01/09 0833) Resp:  [13-20] 19 (01/09 0833) BP: (119-190)/(51-92) 123/66 (01/09 0833) SpO2:  [91 %-98 %] 98 % (01/09 0833) Weight:  [83.9 kg] 83.9 kg (01/08 1108) Estimated body mass index is 30.79 kg/m as calculated from the following:   Height as of this encounter: 5\' 5"  (1.651 m).   Weight as of this encounter: 83.9 kg.   Intake/Output from previous day: 01/08 0701 - 01/09 0700 In: 1650.1 [I.V.:1200; IV Piggyback:450.1] Out: 250 [Blood:250] Intake/Output this shift: No intake/output data recorded.  Physical exam the patient is alert and pleasant.  Her dressing has a bloodstain.  Her lower extremity strength is normal.  She is in no apparent distress.  Lab Results: Recent Labs    06/25/18 1145  WBC 12.1*  HGB 15.4*  HCT 49.1*  PLT 348   BMET Recent Labs    06/25/18 1145  NA 140  K 3.4*  CL 107  CO2 21*  GLUCOSE 118*  BUN 7*  CREATININE 0.85  CALCIUM 10.0    Studies/Results: Dg Lumbar Spine 1 View  Result Date: 06/25/2018 CLINICAL DATA:  Intraoperative localization for lumbar laminectomy. EXAM: LUMBAR SPINE - 1 VIEW COMPARISON:  04/15/2018 FINDINGS: Numbering nomenclature is similar to that seen on prior MRI examination is utilized. Surgical instrument and retractors are noted posterior to the L3-4 interspace and the L4 vertebral body. IMPRESSION: Intraoperative localization at and just below L3-4. Electronically Signed   By: Alcide Clever M.D.   On: 06/25/2018 16:15    Assessment/Plan: Postop day #1: The patient is doing well except for her dizziness.  PT and OT are working with her.  She may go home  tomorrow.  LOS: 0 days     Cristi Loron 06/26/2018, 9:37 AM

## 2018-06-26 NOTE — Evaluation (Signed)
Occupational Therapy Evaluation Patient Details Name: Erica Sanders MRN: [id number] DOB: 07/03/1940 Today's Date: 06/26/2018    History of Present Illness Pt is a 78 y/o female s/p Laminectomy and foraminotomy L2-3-4. Pt has a PMh including COPD, DDD, Depression, Dyspnea, Erythrocytosis (11/19/2011), Fibromyalgia, HTN, Osteoarthritis, and Panic attacks.    Clinical Impression   PTA Pt mod I at home with daughter assisting with IADL - Pt has shower chair for bathing. Pt is currently mod A for transfer from EOB with RW (vc for safe hand placement), close min guard for in room mobility with RW - increased time. Pt was dizzy with positional changes and head turns. Pt max A for LB ADL - unable to cross feet to knees for "figure 4" dressing - verbally reviewed AE for LB ADL - however Pt states that her daughter will be there to help her with those things. Reviewed back handout in full with emphasis on BLT precautions and compensatory strategies for ADL. OT will continue to follow acutely to maximize safety and independence in ADL and functional transfers.      Follow Up Recommendations  Supervision/Assistance - 24 hour    Equipment Recommendations  3 in 1 bedside commode;Other (comment)(RW)    Recommendations for Other Services       Precautions / Restrictions Precautions Precautions: Back Precaution Booklet Issued: Yes (comment) Precaution Comments: reviewed handout in full Required Braces or Orthoses: (no brace) Restrictions Weight Bearing Restrictions: No      Mobility Bed Mobility               General bed mobility comments: EOB when OT entered the room  Transfers Overall transfer level: Needs assistance Equipment used: Rolling walker (2 wheeled) Transfers: Sit to/from Stand Sit to Stand: Mod assist         General transfer comment: vc for safe hand placement. mod A from surface of bed    Balance Overall balance assessment: Needs assistance Sitting-balance  support: No upper extremity supported;Feet supported Sitting balance-Leahy Scale: Fair Sitting balance - Comments: able to sit EOB at supervision level   Standing balance support: Bilateral upper extremity supported Standing balance-Leahy Scale: Poor Standing balance comment: dependent on RW for balance                           ADL either performed or assessed with clinical judgement   ADL Overall ADL's : Needs assistance/impaired Eating/Feeding: Independent   Grooming: Min guard;Standing Grooming Details (indicate cue type and reason): decreased standing tolerance Upper Body Bathing: Moderate assistance;Sitting;With caregiver independent assisting;With adaptive equipment Upper Body Bathing Details (indicate cue type and reason): educated in long handle sponge for bathing Lower Body Bathing: Maximal assistance;With caregiver independent assisting;With adaptive equipment;Sitting/lateral leans Lower Body Bathing Details (indicate cue type and reason): educated in long handle soponge Upper Body Dressing : Minimal assistance;Sitting   Lower Body Dressing: Moderate assistance;Sit to/from stand Lower Body Dressing Details (indicate cue type and reason): unable to access LB for dressing - daughter plans on assisting Toilet Transfer: Moderate assistance;Ambulation;RW Toilet Transfer Details (indicate cue type and reason): mod A for initial boost from bed, min guard with RW once on feet - vc for hand placement with transfer Toileting- Clothing Manipulation and Hygiene: Moderate assistance;Sit to/from stand Toileting - Clothing Manipulation Details (indicate cue type and reason): toilet aide for rear peri care     Functional mobility during ADLs: Min guard;Rolling walker(mod A to stand up for mobility) General ADL  Comments: Pt limited by dizziness, and drowsiness this session     Vision Baseline Vision/History: Wears glasses Wears Glasses: At all times Patient Visual Report: No  change from baseline       Perception     Praxis      Pertinent Vitals/Pain       Hand Dominance Right   Extremity/Trunk Assessment Upper Extremity Assessment Upper Extremity Assessment: Overall WFL for tasks assessed;Generalized weakness   Lower Extremity Assessment Lower Extremity Assessment: Defer to PT evaluation   Cervical / Trunk Assessment Cervical / Trunk Assessment: Other exceptions Cervical / Trunk Exceptions: s/p lumbar sx   Communication Communication Communication: No difficulties   Cognition Arousal/Alertness: Suspect due to medications Behavior During Therapy: WFL for tasks assessed/performed Overall Cognitive Status: Within Functional Limits for tasks assessed                                 General Comments: very sleepy throughout session, suspect medications   General Comments  Pt's daughter present for education, Pt with dizziness - noted increased with head turns - alerted PT that will be working with her to check for BPPV? Pt reports that she's had vertigo in the past    Exercises     Shoulder Instructions      Home Living Family/patient expects to be discharged to:: Private residence Living Arrangements: Children Available Help at Discharge: Family;Available 24 hours/day Type of Home: House Home Access: Stairs to enter [employer] of Steps: 1 wide stair Entrance Stairs-Rails: None Home Layout: Multi-level;Laundry or work area in basement;Able to live on main level with bedroom/bathroom     Bathroom Shower/Tub: Chief Strategy Officer: Pharmacist, community: Yes How Accessible: Accessible via walker Home Equipment: Emergency planning/management officer - standard          Prior Functioning/Environment Level of Independence: Independent        Comments: got assist for IADL from daughter - was not able to go downstairs to basement since March        OT Problem List: Decreased activity  tolerance;Impaired balance (sitting and/or standing);Decreased knowledge of use of DME or AE;Decreased knowledge of precautions;Pain      OT Treatment/Interventions: Self-care/ADL training;DME and/or AE instruction;Patient/family education;Balance training    OT Goals(Current goals can be found in the care plan section) Acute Rehab OT Goals Patient Stated Goal: to be able to get up and down stairs again OT Goal Formulation: With patient/family Time For Goal Achievement: 07/10/18 Potential to Achieve Goals: Good ADL Goals Pt Will Perform Grooming: with supervision;standing Pt Will Perform Lower Body Bathing: with set-up;with adaptive equipment;sit to/from stand Pt Will Perform Lower Body Dressing: with caregiver independent in assisting;with min assist;sit to/from stand Pt Will Transfer to Toilet: with modified independence;ambulating Pt Will Perform Toileting - Clothing Manipulation and hygiene: with set-up;sitting/lateral leans Pt Will Perform Tub/Shower Transfer: Tub transfer;with min guard assist;with caregiver independent in assisting;shower seat Additional ADL Goal #1: Pt will perform bed mobility demonstrating log roll technique prior to engaging in ADL activity  OT Frequency: Min 2X/week   Barriers to D/C:            Co-evaluation              AM-PAC OT "6 Clicks" Daily Activity     Outcome Measure Help from another person eating meals?: None Help from another person taking care of personal grooming?: A Little Help from another person  toileting, which includes using toliet, bedpan, or urinal?: A Lot Help from another person bathing (including washing, rinsing, drying)?: A Lot Help from another person to put on and taking off regular upper body clothing?: A Little Help from another person to put on and taking off regular lower body clothing?: A Lot 6 Click Score: 16   End of Session Equipment Utilized During Treatment: Gait belt;Rolling walker Nurse Communication:  Mobility status;Precautions;Other (comment)(concern for vertigo/BPPV)  Activity Tolerance: Patient tolerated treatment well;Other (comment)(impacted by dizziness, fatigue) Patient left: in chair;with call bell/phone within reach;with family/visitor present  OT Visit Diagnosis: Unsteadiness on feet (R26.81);Other abnormalities of gait and mobility (R26.89);Pain;Dizziness and giddiness (R42) Pain - part of body: (back/lumbar)                Time: 4098-1191: 0835-0856 OT Time Calculation (min): 21 min Charges:  OT General Charges $OT Visit: 1 Visit OT Evaluation $OT Eval Moderate Complexity: 1 Mod  Sherryl MangesLaura Shoua Ressler OTR/L Acute Rehabilitation Services Pager: [phone] Office: [phone]  Evern BioLaura J Kherington Meraz 06/26/2018, 9:17 AM

## 2018-06-26 NOTE — Evaluation (Signed)
Physical Therapy Evaluation Patient Details Name: Erica SalmonsKatherine Sanders MRN: [id number] DOB: 12/06/1940 Today's Date: 06/26/2018   History of Present Illness  Pt is a 78 y/o female s/p Laminectomy and foraminotomy L2-3-4. Pt has a PMh including COPD, DDD, Depression, Dyspnea, Erythrocytosis (11/19/2011), Fibromyalgia, HTN, Osteoarthritis, and Panic attacks.   Clinical Impression  Pt admitted with above diagnosis. Pt currently with functional limitations due to the deficits listed below (see PT Problem List). At the time of PT eval pt was able to perform transfers and ambulation with up to mod assist for balance support and safety. Pt limited by dizziness throughout session. Did not note any nystagmus during modified testing (limited due to back precautions), however did notice decreased smooth pursuit and rigid saccadic eye movements with visual tracking. Pt will benefit from skilled PT to increase their independence and safety with mobility to allow discharge to the venue listed below.       Follow Up Recommendations No PT follow up;Supervision - Intermittent    Equipment Recommendations  Rolling walker with 5" wheels;3in1 (PT)    Recommendations for Other Services       Precautions / Restrictions Precautions Precautions: Back Precaution Booklet Issued: Yes (comment) Precaution Comments: Pt was cued for maintenance of precautions during functional mobility Required Braces or Orthoses: (no brace per MD order) Restrictions Weight Bearing Restrictions: No      Mobility  Bed Mobility Overal bed mobility: Needs Assistance Bed Mobility: Rolling;Sidelying to Sit;Sit to Sidelying Rolling: Min assist Sidelying to sit: Min assist     Sit to sidelying: Min assist General bed mobility comments: Pt required almost constant cueing for proper log roll technique.  Transfers Overall transfer level: Needs assistance Equipment used: Rolling walker (2 wheeled) Transfers: Sit to/from Stand Sit to  Stand: Mod assist         General transfer comment: vc for safe hand placement. mod A from surface of bed  Ambulation/Gait Ambulation/Gait assistance: Min guard Gait Distance (Feet): 120 Feet Assistive device: Rolling walker (2 wheeled) Gait Pattern/deviations: Decreased stride length;Step-to pattern;Trunk flexed Gait velocity: Decreased Gait velocity interpretation: <1.8 ft/sec, indicate of risk for recurrent falls General Gait Details: VC's for improved posture and general safety with RW use.   Stairs            Wheelchair Mobility    Modified Rankin (Stroke Patients Only)       Balance Overall balance assessment: Needs assistance Sitting-balance support: No upper extremity supported;Feet supported Sitting balance-Leahy Scale: Fair Sitting balance - Comments: able to sit EOB at supervision level   Standing balance support: Bilateral upper extremity supported Standing balance-Leahy Scale: Poor Standing balance comment: dependent on RW for balance                             Pertinent Vitals/Pain Pain Assessment: Faces Faces Pain Scale: Hurts even more Pain Location: back Pain Descriptors / Indicators: Operative site guarding;Aching;Discomfort;Grimacing Pain Intervention(s): Monitored during session    Home Living Family/patient expects to be discharged to:: Private residence Living Arrangements: Children Available Help at Discharge: Family;Available 24 hours/day Type of Home: House Home Access: Stairs to enter Entrance Stairs-Rails: None Entrance Stairs-Number of Steps: 1 wide stair Home Layout: Multi-level;Laundry or work area in basement;Able to live on main level with bedroom/bathroom Home Equipment: Emergency planning/management officerhower seat;Walker - standard      Prior Function Level of Independence: Independent         Comments: got assist for IADL from  daughter - was not able to go downstairs to basement since March     Hand Dominance   Dominant Hand:  Right    Extremity/Trunk Assessment   Upper Extremity Assessment Upper Extremity Assessment: Overall WFL for tasks assessed    Lower Extremity Assessment Lower Extremity Assessment: Generalized weakness(Consistent with pre-op diagnosis)    Cervical / Trunk Assessment Cervical / Trunk Assessment: Other exceptions Cervical / Trunk Exceptions: s/p lumbar sx  Communication   Communication: No difficulties  Cognition Arousal/Alertness: Awake/alert Behavior During Therapy: WFL for tasks assessed/performed;Anxious Overall Cognitive Status: Within Functional Limits for tasks assessed                                 General Comments: very sleepy throughout session, suspect medications      General Comments      Exercises     Assessment/Plan    PT Assessment Patient needs continued PT services  PT Problem List Decreased strength;Decreased range of motion;Decreased activity tolerance;Decreased balance;Decreased mobility;Decreased knowledge of use of DME;Decreased safety awareness;Decreased knowledge of precautions;Pain       PT Treatment Interventions DME instruction;Gait training;Stair training;Functional mobility training;Therapeutic activities;Therapeutic exercise;Neuromuscular re-education;Patient/family education    PT Goals (Current goals can be found in the Care Plan section)  Acute Rehab PT Goals Patient Stated Goal: to be able to get up and down stairs again PT Goal Formulation: With patient/family Time For Goal Achievement: 07/03/18 Potential to Achieve Goals: Good    Frequency Min 5X/week   Barriers to discharge        Co-evaluation               AM-PAC PT "6 Clicks" Mobility  Outcome Measure Help needed turning from your back to your side while in a flat bed without using bedrails?: A Little Help needed moving from lying on your back to sitting on the side of a flat bed without using bedrails?: A Little Help needed moving to and from a  bed to a chair (including a wheelchair)?: A Little Help needed standing up from a chair using your arms (e.g., wheelchair or bedside chair)?: A Little Help needed to walk in hospital room?: A Little Help needed climbing 3-5 steps with a railing? : A Lot 6 Click Score: 17    End of Session Equipment Utilized During Treatment: Gait belt Activity Tolerance: Patient limited by pain(Limited by dizziness) Patient left: in bed;with call bell/phone within reach;with family/visitor present Nurse Communication: Mobility status PT Visit Diagnosis: Unsteadiness on feet (R26.81);Pain Pain - part of body: (back)    Time: 6295-2841 PT Time Calculation (min) (ACUTE ONLY): 35 min   Charges:   PT Evaluation $PT Eval Moderate Complexity: 1 Mod PT Treatments $Gait Training: 8-22 mins        Conni Slipper, PT, DPT Acute Rehabilitation Services Pager: [phone] Office: [phone]   Marylynn Pearson 06/26/2018, 1:15 PM

## 2018-06-27 DIAGNOSIS — M5116 Intervertebral disc disorders with radiculopathy, lumbar region: Secondary | ICD-10-CM | POA: Diagnosis not present

## 2018-06-27 MED ORDER — DOCUSATE SODIUM 100 MG PO CAPS: 100.0000 mg | ORAL_CAPSULE | Freq: Two times a day (BID) | ORAL | 0 refills | Status: DC

## 2018-06-27 MED ORDER — CYCLOBENZAPRINE HCL 10 MG PO TABS: 10.0000 mg | ORAL_TABLET | Freq: Three times a day (TID) | ORAL | 0 refills | Status: DC | PRN

## 2018-06-27 MED ORDER — OXYCODONE HCL 5 MG PO TABS: 5.0000 mg | ORAL_TABLET | ORAL | 0 refills | Status: DC | PRN

## 2018-06-27 NOTE — Progress Notes (Signed)
Physical Therapy Treatment Patient Details Name: Erica Sanders MRN: [id number] DOB: [birth year] Today's Date: 06/27/2018    History of Present Illness Pt is a 78 y/o female s/p Laminectomy and foraminotomy L2-3-4. Pt has a PMh including COPD, DDD, Depression, Dyspnea, Erythrocytosis (11/19/2011), Fibromyalgia, HTN, Osteoarthritis, and Panic attacks.     PT Comments    Pt continues to report significant dizziness. Pt was instructed in gaze stabilization techniques during mobility, and was given handout for x1 exercises . Pt reports improvement in symptoms with gaze stabilization, especially during gait training/turns. Pt was instructed in stair training, and reinforced car transfer, precautions, and activity progression. Will continue to follow and progress as able per POC.   Follow Up Recommendations  No PT follow up;Supervision - Intermittent     Equipment Recommendations  Rolling walker with 5" wheels;3in1 (PT)    Recommendations for Other Services       Precautions / Restrictions Precautions Precautions: Back Precaution Booklet Issued: Yes (comment) Precaution Comments: Pt was cued for maintenance of precautions during functional mobility Required Braces or Orthoses: (no brace per MD order) Restrictions Weight Bearing Restrictions: No    Mobility  Bed Mobility Overal bed mobility: Needs Assistance Bed Mobility: Sidelying to Sit   Sidelying to sit: Supervision       General bed mobility comments: Increased time. Pt cued for gaze stabilization upon transition to EOB due to dizziness.   Transfers Overall transfer level: Needs assistance Equipment used: Rolling walker (2 wheeled) Transfers: Sit to/from Stand Sit to Stand: Min guard         General transfer comment: Hands-on guarding for safety. Pt required significant increased time to power-up to full stand. Pt cued for gaze stabilization during stand.   Ambulation/Gait Ambulation/Gait assistance: Min guard Gait  Distance (Feet): 200 Feet Assistive device: Rolling walker (2 wheeled) Gait Pattern/deviations: Decreased stride length;Step-to pattern;Trunk flexed Gait velocity: Decreased as pt fatigues Gait velocity interpretation: 1.31 - 2.62 ft/sec, indicative of limited community ambulator General Gait Details: VC's for improved posture and general safety with RW use. Pt again cued for gaze stabilization especially during turns to control dizziness.    Stairs Stairs: Yes Stairs assistance: Max assist;Min assist Stair Management: No rails;Step to pattern;Forwards;Backwards;With walker Number of Stairs: 6(2x3) General stair comments: Initially attempting with the railing and HHA. Pt does not have rails at home and was not able to power-up one step without rail, even with max assist. pt and daughter were educated on backwards negotation with the RW for support and pt was able to complete with min assist.    Wheelchair Mobility    Modified Rankin (Stroke Patients Only)       Balance Overall balance assessment: Needs assistance Sitting-balance support: No upper extremity supported;Feet supported Sitting balance-Leahy Scale: Fair Sitting balance - Comments: able to sit EOB at supervision level   Standing balance support: Bilateral upper extremity supported Standing balance-Leahy Scale: Poor Standing balance comment: dependent on RW for balance                            Cognition Arousal/Alertness: Awake/alert Behavior During Therapy: WFL for tasks assessed/performed;Anxious Overall Cognitive Status: Within Functional Limits for tasks assessed                                        Exercises      General Comments  Pertinent Vitals/Pain Pain Assessment: Faces Faces Pain Scale: Hurts even more Pain Location: back Pain Descriptors / Indicators: Operative site guarding;Aching;Discomfort;Grimacing Pain Intervention(s): Monitored during session     Home Living                      Prior Function            PT Goals (current goals can now be found in the care plan section) Acute Rehab PT Goals Patient Stated Goal: to be able to get up and down stairs again PT Goal Formulation: With patient/family Time For Goal Achievement: 07/03/18 Potential to Achieve Goals: Good Progress towards PT goals: Progressing toward goals    Frequency    Min 5X/week      PT Plan Current plan remains appropriate    Co-evaluation              AM-PAC PT "6 Clicks" Mobility   Outcome Measure  Help needed turning from your back to your side while in a flat bed without using bedrails?: A Little Help needed moving from lying on your back to sitting on the side of a flat bed without using bedrails?: A Little Help needed moving to and from a bed to a chair (including a wheelchair)?: A Little Help needed standing up from a chair using your arms (e.g., wheelchair or bedside chair)?: A Little Help needed to walk in hospital room?: A Little Help needed climbing 3-5 steps with a railing? : A Lot 6 Click Score: 17    End of Session Equipment Utilized During Treatment: Gait belt Activity Tolerance: Patient limited by pain Patient left: in bed;with call bell/phone within reach;with family/visitor present Nurse Communication: Mobility status PT Visit Diagnosis: Unsteadiness on feet (R26.81);Pain Pain - part of body: (back)     Time: [id number] PT Time Calculation (min) (ACUTE ONLY): 29 min  Charges:  $Gait Training: 23-37 mins                     Conni SlipperLaura Chiniqua Kilcrease, PT, DPT Acute Rehabilitation Services Pager: [phone] Office: [phone]    Marylynn PearsonLaura D Melvin Marmo 06/27/2018, 9:33 AM

## 2018-06-27 NOTE — Progress Notes (Signed)
Patient alert and oriented, Erica Sanders's well, voiding adequate amount of urine, swallowing without difficulty, c/o moderate pain and meds given prior to discharged for ride and discomfort. Patient discharged home with family. Script and discharged instructions given to patient. Patient and family stated understanding of instructions given. Patient has an appointment with Dr. Jenkins. 

## 2018-06-27 NOTE — Progress Notes (Signed)
Occupational Therapy Treatment Patient Details Name: Erica Sanders MRN: [id number] DOB: 1941-01-31 Today's Date: 06/27/2018    History of present illness Pt is a 78 y/o female s/p Laminectomy and foraminotomy L2-3-4. Pt has a PMh including COPD, DDD, Depression, Dyspnea, Erythrocytosis (11/19/2011), Fibromyalgia, HTN, Osteoarthritis, and Panic attacks.    OT comments  Pt progressing towards OT goals this session. Pt with improved in room mobility and decreased dizziness with gaze stabilization strategy. Pt was able to get fully dressed. Educated on AE for LB ADL - and Pt practiced with demo kit. Pt also able to perform toilet transfer, peri care, and sink level grooming. Pt did need min cues throughout for back precautions. Pt will have assist from daughter at discharge.   Follow Up Recommendations  Supervision/Assistance - 24 hour    Equipment Recommendations  3 in 1 bedside commode;Other (comment)    Recommendations for Other Services      Precautions / Restrictions Precautions Precautions: Back Precaution Booklet Issued: Yes (comment) Precaution Comments: Pt was cued for maintenance of precautions during ADL Required Braces or Orthoses: (no brace per MD order) Restrictions Weight Bearing Restrictions: No       Mobility Bed Mobility Overal bed mobility: Needs Assistance Bed Mobility: Rolling;Sit to Sidelying Rolling: Min guard Sidelying to sit: Supervision     Sit to sidelying: Mod assist General bed mobility comments: mod A for BLE back into bed, reviewed log roll technique  Transfers Overall transfer level: Needs assistance Equipment used: Rolling walker (2 wheeled) Transfers: Sit to/from Stand Sit to Stand: Min guard         General transfer comment: Hands-on guarding for safety. Pt required significant increased time to power-up to full stand. Pt cued for gaze stabilization during stand.     Balance Overall balance assessment: Needs  assistance Sitting-balance support: No upper extremity supported;Feet supported Sitting balance-Leahy Scale: Fair Sitting balance - Comments: able to sit EOB at supervision level   Standing balance support: Bilateral upper extremity supported Standing balance-Leahy Scale: Poor Standing balance comment: dependent on RW for balance                           ADL either performed or assessed with clinical judgement   ADL Overall ADL's : Needs assistance/impaired     Grooming: Min guard;Standing;Wash/dry hands Grooming Details (indicate cue type and reason): sink level, cues for no-twisting         Upper Body Dressing : Minimal assistance;Sitting Upper Body Dressing Details (indicate cue type and reason): to don shirt Lower Body Dressing: Moderate assistance;Sit to/from stand Lower Body Dressing Details (indicate cue type and reason): Pt able to utilize reacher/grabber, and sock donner for underwear, pants, and socks - educated on AE for LB dressing (sock donner, grabber/reacher, long handle shoe horn) Toilet Transfer: Min guard;Ambulation;RW Toilet Transfer Details (indicate cue type and reason): encouraged gaze stabilization Toileting- Clothing Manipulation and Hygiene: Min guard;Sit to/from stand Toileting - Clothing Manipulation Details (indicate cue type and reason): able to perform peri care, manage underwear and pants     Functional mobility during ADLs: Min guard;Rolling walker       Vision       Perception     Praxis      Cognition Arousal/Alertness: Awake/alert Behavior During Therapy: WFL for tasks assessed/performed;Anxious Overall Cognitive Status: Within Functional Limits for tasks assessed  Exercises     Shoulder Instructions       General Comments Daughter Freda Munro present throughout session    Pertinent Vitals/ Pain       Pain Assessment: Faces Faces Pain Scale: Hurts whole  lot Pain Location: back Pain Descriptors / Indicators: Operative site guarding;Aching;Discomfort;Grimacing Pain Intervention(s): Monitored during session;Repositioned  Home Living                                          Prior Functioning/Environment              Frequency  Min 2X/week        Progress Toward Goals  OT Goals(current goals can now be found in the care plan section)  Progress towards OT goals: Progressing toward goals  Acute Rehab OT Goals Patient Stated Goal: to be able to get up and down stairs again OT Goal Formulation: With patient/family Time For Goal Achievement: 07/10/18 Potential to Achieve Goals: Good  Plan Discharge plan remains appropriate;Frequency remains appropriate    Co-evaluation                 AM-PAC OT "6 Clicks" Daily Activity     Outcome Measure   Help from another person eating meals?: None Help from another person taking care of personal grooming?: A Little Help from another person toileting, which includes using toliet, bedpan, or urinal?: A Lot Help from another person bathing (including washing, rinsing, drying)?: A Lot Help from another person to put on and taking off regular upper body clothing?: A Little Help from another person to put on and taking off regular lower body clothing?: A Lot 6 Click Score: 16    End of Session Equipment Utilized During Treatment: Gait belt;Rolling walker  OT Visit Diagnosis: Unsteadiness on feet (R26.81);Other abnormalities of gait and mobility (R26.89);Pain;Dizziness and giddiness (R42) Pain - part of body: (back)   Activity Tolerance Patient tolerated treatment well   Patient Left in bed;with call bell/phone within reach;with family/visitor present   Nurse Communication Mobility status;Precautions        Time: 0931-1216 OT Time Calculation (min): 36 min  Charges: OT General Charges $OT Visit: 1 Visit OT Treatments $Self Care/Home Management : 23-37  mins  Hulda Humphrey OTR/L Acute Rehabilitation Services Pager: [phone] Office: [employer] 06/27/2018, 10:37 AM

## 2018-06-27 NOTE — Discharge Summary (Signed)
Physician Discharge Summary  Patient ID: Erica Sanders MRN: [id number] DOB/AGE: 1941/04/20 78 y.o.  Admit date: 06/25/2018 Discharge date: 06/27/2018  Admission Diagnoses: L2-3 and L3-4 spinal stenosis, L3-4 herniated disc, lumbago, lumbar radiculopathy  Discharge Diagnoses: The same Active Problems:   Lumbar stenosis with neurogenic claudication   Discharged Condition: good  Hospital Course: I performed an L2-3 and L3-4 laminectomy with a right L3-4 discectomy on 06/25/2018.  The surgery went well.  The patient's postoperative course was only remarkable for some dizziness which is a chronic problem for her.  On postop day #2 the patient looked and felt much better.  She will likely go home later on today.  I gave her discharge instructions and answered all the patient's, and her daughters, questions.  Consults: Physical therapy Significant Diagnostic Studies: None Treatments: L2-3 and L3-4 laminectomy, right L3-4 discectomy using microdissection Discharge Exam: Blood pressure 105/89, pulse 77, temperature 97.8 F (36.6 C), temperature source Oral, resp. rate 18, height 5\' 5"  (1.651 m), weight 83.9 kg, SpO2 93 %. The patient is alert and pleasant.  Her strength is grossly normal in her lower extremities.  Disposition: Home   Allergies as of 06/27/2018      Reactions   Naprosyn [naproxen] Anaphylaxis, Shortness Of Breath, Swelling   Nsaids Other (See Comments)   "swells"   Valium [diazepam] Other (See Comments)   "makes me Hyper"   Codeine Nausea And Vomiting   Morphine And Related Other (See Comments)   "Makes me feel stupid"      Medication List    TAKE these medications   acetaminophen 500 MG tablet Commonly known as:  TYLENOL Take 500 mg by mouth every 6 (six) hours as needed for mild pain or moderate pain.   albuterol 108 (90 Base) MCG/ACT inhaler Commonly known as:  PROVENTIL HFA;VENTOLIN HFA Inhale 2 puffs into the lungs every 6 (six) hours as needed for wheezing  or shortness of breath.   ALPRAZolam 0.5 MG tablet Commonly known as:  XANAX Take 0.25 mg by mouth 2 (two) times daily.   cyclobenzaprine 10 MG tablet Commonly known as:  FLEXERIL Take 1 tablet (10 mg total) by mouth 3 (three) times daily as needed for muscle spasms.   diltiazem 180 MG 24 hr capsule Commonly known as:  DILACOR XR Take 180 mg by mouth daily.   docusate sodium 100 MG capsule Commonly known as:  COLACE Take 1 capsule (100 mg total) by mouth 2 (two) times daily.   FLUoxetine 20 MG capsule Commonly known as:  PROZAC Take 20 mg by mouth daily.   oxyCODONE 5 MG immediate release tablet Commonly known as:  Oxy IR/ROXICODONE Take 1 tablet (5 mg total) by mouth every 4 (four) hours as needed for moderate pain ((score 4 to 6)).   polyethylene glycol packet Commonly known as:  MIRALAX / GLYCOLAX Take 17 g by mouth daily.   potassium chloride SA 20 MEQ tablet Commonly known as:  K-DUR,KLOR-CON Take 20 mEq by mouth daily.   SENNA-PLUS 8.6-50 MG tablet Generic drug:  senna-docusate Take 2 tablets by mouth at bedtime.   Vitamin D3 1.25 MG (50000 UT) Caps Take 50,000 Units by mouth 2 (two) times a week.        Signed: Cristi Loron 06/27/2018, 7:36 AM

## 2018-06-30 ENCOUNTER — Inpatient Hospital Stay (HOSPITAL_COMMUNITY)
Admission: RE | Admit: 2018-06-30 | Discharge: 2018-06-30 | Disposition: A | Discharge status: Home / Self Care | Payer: PRIVATE HEALTH INSURANCE | Source: Ambulatory Visit

## 2020-09-05 ENCOUNTER — Ambulatory Visit: Payer: Self-pay | Admitting: General Surgery

## 2020-09-05 NOTE — H&P (Signed)
History of Present Illness Erica Filler MD; 09/05/2020 4:13 PM) The patient is a 80 year old female who presents with a hiatal hernia. Patient is a 80 year old female, who comes in for follow-up for a large hiatal hernia. Patient did have a CT scan that was sent down. I was able to be. This did show a moderate size hiatal hernia. Patient continues with dysphagia and chest pain.  Patient's had clearance by Dr. Leavy Cella her pulmonologist.  -------------- Patient is a 80 year old female, who is referred by Dr. Leavy Cella, for evaluation of a large hiatal hernia. Patient has a history of COPD, chronic back pain, who comes in with a significant hiatal hernia. Patient was recently hospitalized [place] secondary to a fall, hiatal hernia. She does state that she had a COPD exacerbation as well. Patient states that she was known of the however hernia approximately fo4- 5 years. She states that she has had some dysphagia at times. She's had some reflux and regurgitation of food. Patient has had some chest pain as well. She states that she sleeps on one pillow it pulled that up.  Patient underwent CT scan which revealed a type IV hiatal hernia that contained her colon. I do not have those records currently.   Patient's had previous hysterectomy, laparoscopic cholecystectomy and appendectomy.  Patient currently sees Dr. Leavy Cella in [place] is her pulmonologist.    Medication History Harold Barban, CMA; 09/05/2020 3:32 PM) ALPRAZolam (0.25MG  Tablet, Oral) Active. Cefuroxime Axetil (250MG  Tablet, Oral) Active. dilTIAZem HCl ER Coated Beads (180MG  Capsule ER 24HR, Oral) Active. Doxycycline Hyclate (100MG  Tablet, Oral) Active. Ipratropium-Albuterol (0.5-2.5 (3)MG/3ML Solution, Inhalation) Active. Lisinopril (5MG  Tablet, Oral) Active. Metoprolol Succinate ER (25MG  Tablet ER 24HR, Oral) Active. Magnesium Oxide (400MG  Tablet, Oral) Active. Metoprolol Tartrate (50MG  Tablet, Oral)  Active. FLUoxetine HCl (20MG  Capsule, Oral) Active. Famotidine (40MG  Tablet, Oral) Active. predniSONE (5MG  Tablet, Oral) Active. methylPREDNISolone (4MG  Tab Ther Pack, Oral) Active. Omeprazole (20MG  Capsule DR, Oral) Active. Vitamin D3 (1.25 MG(50000 UT) Capsule, Oral) Active. predniSONE (10MG  (48) Tab Ther Pack, Oral) Active. Klor-Con M20 ( Tablet ER, Oral) Active. Medications Reconciled    Review of Systems , MD; 09/05/2020 4:15 PM) General Present- Fatigue. Not Present- Appetite Loss, Chills, Fever, Night Sweats, Weight Gain and Weight Loss. Skin Present- Dryness. Not Present- Change in Wart/Mole, Hives, Jaundice, New Lesions, Non-Healing Wounds, Rash and Ulcer. HEENT Present- Ringing in the Ears, Seasonal Allergies and Wears glasses/contact lenses. Not Present- Earache, Hearing Loss, Hoarseness, Nose Bleed, Oral Ulcers, Sinus Pain, Sore Throat, Visual Disturbances and Yellow Eyes. Respiratory Present- Difficulty Breathing and Wheezing. Not Present- Bloody sputum, Chronic Cough and Snoring. Breast Not Present- Breast Mass, Breast Pain, Nipple Discharge and Skin Changes. Cardiovascular Present- Leg Cramps and Shortness of Breath. Not Present- Chest Pain, Difficulty Breathing Lying Down, Palpitations, Rapid Heart Rate and Swelling of Extremities. Gastrointestinal Present- Bloating, Difficulty Swallowing, Excessive gas, Gets full quickly at meals and Indigestion. Not Present- Abdominal Pain, Bloody Stool, Change in Bowel Habits, Chronic diarrhea, Constipation, Hemorrhoids, Nausea, Rectal Pain and Vomiting. Female Genitourinary Not Present- Frequency, Nocturia, Painful Urination, Pelvic Pain and Urgency. Musculoskeletal Present- Back Pain, Joint Pain, Joint Stiffness, Muscle Pain and Muscle Weakness. Not Present- Swelling of Extremities. Neurological Present- Trouble walking and Weakness. Not Present- Decreased Memory, Fainting, Headaches, Numbness, Seizures,  Tingling and Tremor. Psychiatric Present- Anxiety and Depression. Not Present- Bipolar, Change in Sleep Pattern, Fearful and Frequent crying. Endocrine Not Present- Cold Intolerance, Excessive Hunger, Hair Changes, Heat Intolerance, Hot flashes and New Diabetes. Hematology Present-  Easy Bruising. Not Present- Blood Thinners, Excessive bleeding, Gland problems, HIV and Persistent Infections. All other systems negative   Physical Exam Erica Filler, MD; 09/05/2020 4:15 PM) The physical exam findings are as follows: Note: Constitutional: No acute distress, conversant, appears stated age  Eyes: Anicteric sclerae, moist conjunctiva, no lid lag  Neck: No thyromegaly, trachea midline, no cervical lymphadenopathy  Lungs: Clear to auscultation biilaterally, normal respiratory effot  Cardiovascular: regular rate & rhythm, no murmurs, no peripheal edema, pedal pulses 2+  GI: Soft, no masses or hepatosplenomegaly, non-tender to palpation  MSK: Normal gait, no clubbing cyanosis, edema  Skin: No rashes, palpation reveals normal skin turgor  Psychiatric: Appropriate judgment and insight, oriented to person, place, and time    Assessment & Plan Erica Filler MD; 09/05/2020 4:14 PM) HIATAL HERNIA (K44.9) Impression: Patient is a 80 year old female with moderate size hiatal hernia, dysphagia I long discussion with the patient and her daughter. I discussed with her the options of reduction of the hiatal hernia and G-tube placement. Also discussed the option of her hernia repair and fundoplication. Secondary to the patient's pulmonary issues she chose for the reduction of gastric anatomy, G-tube placement and gastropexy. I believe this will help relieve her symptoms and allow her to eat. I discussed with her that the G-tube would be in place for 3-4 months. This may cause some annoyance to her.  1. Will proceed to the operating room for laparoscopic gastropexy and gastric tube insertion. 2. I  discussed with her the risks and benefits of the procedure to include but not limited to: Infection, bleeding, damage to structures, and possible recurrence. The patient was understanding and wished to proceed.

## 2020-10-28 ENCOUNTER — Other Ambulatory Visit (HOSPITAL_COMMUNITY): Payer: 59

## 2020-11-11 NOTE — Progress Notes (Signed)
Surgical Instructions    Your procedure is scheduled on June 1.  Report to [facility] Main Entrance "A" at 6:30 A.M., then check in with the Admitting office.  Call this number if you have problems the morning of surgery:  [phone]   If you have any questions prior to your surgery date call [phone]: Open Monday-Friday 8am-4pm    Remember:  Do not eat after midnight the night before your surgery  You may drink clear liquids until 5:30 the morning of your surgery.   Clear liquids allowed are: Water, Non-Citrus Juices (without pulp), Carbonated Beverages, Clear Tea, Black Coffee Only, and Gatorade   Please complete your PRE-SURGERY ENSURE that was provided to you by 5:30 the morning of surgery.  Please, if able, drink it in one setting. DO NOT SIP.   Take these medicines the morning of surgery with A SIP OF WATER : ALPRAZolam (XANAX) 0.25 MG tablet cetirizine (ZYRTEC) 10 MG tablet FLUoxetine (PROZAC) 20 MG capsule if needed fluticasone (FLONASE) 50 MCG/ACT nasal spray if needed metoprolol succinate (TOPROL-XL) 25 MG 24 hr tablet sulfamethoxazole-trimethoprim (BACTRIM DS) 800-160 MG tablet  Please bring all inhalers with you the day of surgery.    As of today, STOP taking any Aspirin (unless otherwise instructed by your surgeon) Aleve, Naproxen, Ibuprofen, Motrin, Advil, Goody's, BC's, all herbal medications, fish oil, and all vitamins.                     Do not wear jewelry, make up, or nail polish DO Not wear nail polish, gel polish, artificial nails, or any other type of covering on natural nails including finger and toenails. If patients have artificial nails, gel coating, etc. that need to be removed by a nail saloon please have this removed prior to surgery or surgery may need to be canceled/delayed if the surgeon/ anesthesia feels like the patient is unable to be adequately monitored.            Do not wear lotions, powders, perfumes/colognes, or deodorant.             Do not shave 48 hours prior to surgery.  Men may shave face and neck.            Do not bring valuables to the hospital.            [facility] is not responsible for any belongings or valuables.  Do NOT Smoke (Tobacco/Vaping) or drink Alcohol 24 hours prior to your procedure If you use a CPAP at night, you may bring all equipment for your overnight stay.   Contacts, glasses, dentures or bridgework may not be worn into surgery, please bring cases for these belongings   For patients admitted to the hospital, discharge time will be determined by your treatment team.   Patients discharged the day of surgery will not be allowed to drive home, and someone needs to stay with them for 24 hours.    Special instructions:    Oral Hygiene is also important to reduce your risk of infection.  Remember - BRUSH YOUR TEETH THE MORNING OF SURGERY WITH YOUR REGULAR TOOTHPASTE   [facility]- Preparing For Surgery  Before surgery, you can play an important role. Because skin is not sterile, your skin needs to be as free of germs as possible. You can reduce the number of germs on your skin by washing with CHG (chlorahexidine gluconate) Soap before surgery.  CHG is an antiseptic cleaner which kills germs  and bonds with the skin to continue killing germs even after washing.     Please do not use if you have an allergy to CHG or antibacterial soaps. If your skin becomes reddened/irritated stop using the CHG.  Do not shave (including legs and underarms) for at least 48 hours prior to first CHG shower. It is OK to shave your face.  Please follow these instructions carefully.    1.  Shower the NIGHT BEFORE SURGERY and the MORNING OF SURGERY with CHG Soap.   If you chose to wash your hair, wash your hair first as usual with your normal shampoo. After you shampoo, rinse your hair and body thoroughly to remove the shampoo.  Then [employer] and genitals (private parts) with your normal soap and rinse thoroughly to  remove soap.  2. After that Use CHG Soap as you would any other liquid soap. You can apply CHG directly to the skin and wash gently with a scrungie or a clean washcloth.   3. Apply the CHG Soap to your body ONLY FROM THE NECK DOWN.  Do not use on open wounds or open sores. Avoid contact with your eyes, ears, mouth and genitals (private parts). Wash Face and genitals (private parts)  with your normal soap.   4. Wash thoroughly, paying special attention to the area where your surgery will be performed.  5. Thoroughly rinse your body with warm water from the neck down.  6. DO NOT shower/wash with your normal soap after using and rinsing off the CHG Soap.  7. Pat yourself dry with a CLEAN TOWEL.  8. Wear CLEAN PAJAMAS to bed the night before surgery  9. Place CLEAN SHEETS on your bed the night before your surgery  10. DO NOT SLEEP WITH PETS.   Day of Surgery:  Take a shower with CHG soap. Wear Clean/Comfortable clothing the morning of surgery Do not apply any deodorants/lotions.   Remember to brush your teeth WITH YOUR REGULAR TOOTHPASTE.   Please read over the following fact sheets that you were given.

## 2020-11-15 ENCOUNTER — Encounter (HOSPITAL_COMMUNITY): Payer: Self-pay

## 2020-11-15 ENCOUNTER — Other Ambulatory Visit (HOSPITAL_COMMUNITY): Payer: 59

## 2020-11-15 ENCOUNTER — Encounter (HOSPITAL_COMMUNITY)
Admission: RE | Admit: 2020-11-15 | Discharge: 2020-11-15 | Disposition: A | Discharge status: Home / Self Care | Payer: Medicare Other | Source: Ambulatory Visit | Attending: General Surgery | Admitting: General Surgery

## 2020-11-15 ENCOUNTER — Other Ambulatory Visit: Payer: Self-pay

## 2020-11-15 DIAGNOSIS — Z01812 Encounter for preprocedural laboratory examination: Secondary | ICD-10-CM | POA: Insufficient documentation

## 2020-11-15 DIAGNOSIS — Z20822 Contact with and (suspected) exposure to covid-19: Secondary | ICD-10-CM | POA: Insufficient documentation

## 2020-11-15 LAB — BASIC METABOLIC PANEL
Anion gap: 8 (ref 5–15)
BUN: 20 mg/dL (ref 8–23)
CO2: 26 mmol/L (ref 22–32)
Calcium: 9.9 mg/dL (ref 8.9–10.3)
Chloride: 103 mmol/L (ref 98–111)
Creatinine, Ser: 1.59 mg/dL — ABNORMAL HIGH (ref 0.44–1.00)
GFR, Estimated: 33 mL/min — ABNORMAL LOW (ref >60)
Glucose, Bld: 118 mg/dL — ABNORMAL HIGH (ref 70–99)
Potassium: 5.5 mmol/L — ABNORMAL HIGH (ref 3.5–5.1)
Sodium: 137 mmol/L (ref 135–145)

## 2020-11-15 LAB — CBC
HCT: 48.8 % — ABNORMAL HIGH (ref 36.0–46.0)
Hemoglobin: 15.6 g/dL — ABNORMAL HIGH (ref 12.0–15.0)
MCH: 28.4 pg (ref 26.0–34.0)
MCHC: 32 g/dL (ref 30.0–36.0)
MCV: 88.9 fL (ref 80.0–100.0)
Platelets: 254 10*3/uL (ref 150–400)
RBC: 5.49 MIL/uL — ABNORMAL HIGH (ref 3.87–5.11)
RDW: 14.8 % (ref 11.5–15.5)
WBC: 9.2 10*3/uL (ref 4.0–10.5)
nRBC: 0 % (ref 0.0–0.2)

## 2020-11-15 LAB — SARS CORONAVIRUS 2 (TAT 6-24 HRS): SARS Coronavirus 2: NEGATIVE

## 2020-11-15 NOTE — Progress Notes (Signed)
Anesthesia Chart Review:   Case: [id number] Date/Time: 11/16/20 0815   Procedure: LAPAROSCOPIC GASTROPEXY AND INSERTION GASTROSTOMY TUBE (N/A )   Anesthesia type: General   Pre-op diagnosis: HIATAL HERNIA DYSPHAGIA   [facility] / [facility]   Surgeons: Axel Filler, MD      DISCUSSION: Pt is 80 years old with hx HTN, COPD  Pt and daughter reported to pre-admission testing nurse pt had cardiac evaluation in December 2021.  Reason for cardiac evaluation was low O2 sats.  Per pt and daughter, no concerning findings on stress test or echo. Low O2 sats have resolved, apparently thought to be caused by hiatal hernia. Requested records but they are pending at this time.    VS: BP (!) 149/76   Pulse 71   Temp 36.4 C (Oral)   Resp 18   Ht 5\' 5"  (1.651 m)   Wt 85.1 kg   SpO2 99%   BMI 31.21 kg/m   PROVIDERS: - PCP is in [place], [place] - Cardiologist is Dr. [place] in [place], [place]   LABS: Labs reviewed: Acceptable for surgery. (all labs ordered are listed, but only abnormal results are displayed)  Labs Reviewed  CBC - Abnormal; Notable for the following components:      Result Value   RBC 5.49 (*)    Hemoglobin 15.6 (*)    HCT 48.8 (*)    All other components within normal limits  BASIC METABOLIC PANEL - Abnormal; Notable for the following components:   Potassium 5.5 (*)    Glucose, Bld 118 (*)    Creatinine, Ser 1.59 (*)    GFR, Estimated 33 (*)    All other components within normal limits  SARS CORONAVIRUS 2 (TAT 6-24 HRS)    EKG: requested. If it does not arrive by morning of surgery, will need EKG upon arrival   CV: Stress test requested  Echo requested  Past Medical History:  Diagnosis Date  . Bronchitis    chronic  . Complication of anesthesia    "very slow to wake up."  . COPD (chronic obstructive pulmonary disease) (HCC)   . DDD (degenerative disc disease), lumbar   . Depression   . Dyspnea   . Erythrocytosis 11/19/2011    per hemotologist- stable - possibly due to COPD, no further work up needed.  . Family history of adverse reaction to anesthesia    Daughter to wake up, nausea  . Fibromyalgia   . History of kidney stones    passed  . Hypertension   . Osteoarthritis   . Panic attacks   . Pneumonia   . PONV (postoperative nausea and vomiting)   . Spinal stenosis   . Vitamin D deficiency     Past Surgical History:  Procedure Laterality Date  . ABDOMINAL HYSTERECTOMY  1978  . ANKLE FRACTURE SURGERY Left 2003  . APPENDECTOMY    . COLONOSCOPY    . KNEE SURGERY Left 1980  . LUMBAR FUSION  1975  . LUMBAR LAMINECTOMY/DECOMPRESSION MICRODISCECTOMY N/A 06/25/2018   Procedure: LAMINECTOMY AND FORAMINOTOMY LUMBAR TWO- LUMBAR THREE, LUMBAR FOUR;  Surgeon: 08/24/2018, MD;  Location[facility] OR;  Service: Neurosurgery;  Laterality: N/A;  LAMINECTOMY AND FORAMINOTOMY LUMBAR 2- LUMBAR 3, LUMBAR 3- LUMBAR 4  . MULTIPLE TOOTH EXTRACTIONS      MEDICATIONS: . acetaminophen (TYLENOL) 500 MG tablet  . albuterol (PROVENTIL HFA;VENTOLIN HFA) 108 (90 Base) MCG/ACT inhaler  . ALPRAZolam (XANAX) 0.25 MG tablet  . Apoaequorin (PREVAGEN PO)  .  BIOTIN PO  . cetirizine (ZYRTEC) 10 MG tablet  . Cholecalciferol (VITAMIN D3) 50 MCG (2000 UT) capsule  . diclofenac Sodium (VOLTAREN) 1 % GEL  . famotidine (PEPCID) 40 MG tablet  . FLUoxetine (PROZAC) 20 MG capsule  . fluticasone (FLONASE) 50 MCG/ACT nasal spray  . lisinopril (ZESTRIL) 20 MG tablet  . metoprolol succinate (TOPROL-XL) 25 MG 24 hr tablet  . polyethylene glycol (MIRALAX / GLYCOLAX) packet  . sulfamethoxazole-trimethoprim (BACTRIM DS) 800-160 MG tablet   No current facility-administered medications for this encounter.   If EKG does not arrive, one will need to be obtained DOS. If EKG acceptable, I anticipate pt can proceed with surgery as scheduled.   Rica Mast, PhD, FNP-BC [facility]/Anesthesiology Phone: [phone] 11/15/2020  4:32 PM

## 2020-11-15 NOTE — Progress Notes (Addendum)
PCP - Cleon Gustin - [place] [facility] Cardiologist - [facility]  Chest x-ray - not needed EKG - requesting Stress Test - requesting ECHO - requesting Cardiac Cath - denies  ERAS Protcol - clears until 0530 PRE-SURGERY Ensure or G2- ensure given  COVID TEST-pending tested at PAT   Anesthesia review: yes records requested Spoke to patients daughter Patient had low O2 which caused her to be admitted to the hospital back in December.  patient received come cardiac testing that we are requesting.  It was determined per patients daughter the hernia was causing the issues and no further testing was needed.  Patient was cleared by pulmonologist  Patient denies shortness of breath, fever, cough and chest pain at PAT appointment   All instructions explained to the patient, with a verbal understanding of the material. Patient agrees to go over the instructions while at home for a better understanding. Patient also instructed to self quarantine after being tested for COVID-19. The opportunity to ask questions was provided.

## 2020-11-15 NOTE — Anesthesia Preprocedure Evaluation (Addendum)
Anesthesia Evaluation  Patient identified by MRN, date of birth, ID band Patient awake    Reviewed: Allergy & Precautions, NPO status , Patient's Chart, lab work & pertinent test results  History of Anesthesia Complications (+) PONV  Airway Mallampati: II  TM Distance: >3 FB Neck ROM: Full    Dental  (+) Missing, Poor Dentition   Pulmonary COPD,    Pulmonary exam normal        Cardiovascular hypertension, Pt. on medications and Pt. on home beta blockers  Rhythm:Regular Rate:Normal     Neuro/Psych Anxiety Depression negative neurological ROS     GI/Hepatic Neg liver ROS, hiatal hernia,   Endo/Other    Renal/GU negative Renal ROS  negative genitourinary   Musculoskeletal  (+) Arthritis , Osteoarthritis,  Fibromyalgia -  Abdominal (+)  Abdomen: soft. Bowel sounds: normal.  Peds  Hematology negative hematology ROS (+)   Anesthesia Other Findings   Reproductive/Obstetrics                           Anesthesia Physical Anesthesia Plan  ASA: II  Anesthesia Plan: General   Post-op Pain Management:    Induction: Intravenous  PONV Risk Score and Plan: 4 or greater and Ondansetron, Dexamethasone and Treatment may vary due to age or medical condition  Airway Management Planned: Mask and Oral ETT  Additional Equipment: None  Intra-op Plan:   Post-operative Plan: Extubation in OR  Informed Consent: I have reviewed the patients History and Physical, chart, labs and discussed the procedure including the risks, benefits and alternatives for the proposed anesthesia with the patient or authorized representative who has indicated his/her understanding and acceptance.     Dental advisory given  Plan Discussed with: CRNA  Anesthesia Plan Comments: (See APP note by Joslyn Hy, FNP  Lab Results      Component                Value               Date                      WBC                       9.2                 11/15/2020                HGB                      15.6 (H)            11/15/2020                HCT                      48.8 (H)            11/15/2020                MCV                      88.9                11/15/2020                PLT  254                 11/15/2020           Lab Results      Component                Value               Date                      NA                       137                 11/15/2020                K                        5.5 (H)             11/15/2020                CO2                      26                  11/15/2020                GLUCOSE                  118 (H)             11/15/2020                BUN                      20                  11/15/2020                CREATININE               1.59 (H)            11/15/2020                CALCIUM                  9.9                 11/15/2020                GFRNONAA                 33 (L)              11/15/2020                GFRAA                    >60                 06/25/2018          )      Anesthesia Quick Evaluation

## 2020-11-16 ENCOUNTER — Encounter (HOSPITAL_COMMUNITY): Payer: Self-pay | Admitting: General Surgery

## 2020-11-16 ENCOUNTER — Ambulatory Visit (HOSPITAL_COMMUNITY): Payer: Medicare Other | Admitting: Anesthesiology

## 2020-11-16 ENCOUNTER — Inpatient Hospital Stay (HOSPITAL_COMMUNITY)
Admission: RE | Admit: 2020-11-16 | Discharge: 2020-11-21 | DRG: 328 | Disposition: A | Discharge status: Home Health | Payer: Medicare Other | Attending: General Surgery | Admitting: General Surgery

## 2020-11-16 ENCOUNTER — Encounter (HOSPITAL_COMMUNITY)
Admission: RE | Disposition: A | Discharge status: Home / Self Care | Payer: Self-pay | Source: Home / Self Care | Attending: General Surgery

## 2020-11-16 ENCOUNTER — Other Ambulatory Visit: Payer: Self-pay

## 2020-11-16 DIAGNOSIS — Z20822 Contact with and (suspected) exposure to covid-19: Secondary | ICD-10-CM | POA: Diagnosis present

## 2020-11-16 DIAGNOSIS — J449 Chronic obstructive pulmonary disease, unspecified: Secondary | ICD-10-CM | POA: Diagnosis present

## 2020-11-16 DIAGNOSIS — Z9049 Acquired absence of other specified parts of digestive tract: Secondary | ICD-10-CM

## 2020-11-16 DIAGNOSIS — R1310 Dysphagia, unspecified: Secondary | ICD-10-CM | POA: Diagnosis present

## 2020-11-16 DIAGNOSIS — Z90710 Acquired absence of both cervix and uterus: Secondary | ICD-10-CM

## 2020-11-16 DIAGNOSIS — Z79899 Other long term (current) drug therapy: Secondary | ICD-10-CM

## 2020-11-16 DIAGNOSIS — K219 Gastro-esophageal reflux disease without esophagitis: Secondary | ICD-10-CM | POA: Diagnosis present

## 2020-11-16 DIAGNOSIS — K449 Diaphragmatic hernia without obstruction or gangrene: Principal | ICD-10-CM | POA: Diagnosis present

## 2020-11-16 DIAGNOSIS — D72829 Elevated white blood cell count, unspecified: Secondary | ICD-10-CM

## 2020-11-16 DIAGNOSIS — R0602 Shortness of breath: Secondary | ICD-10-CM

## 2020-11-16 DIAGNOSIS — R112 Nausea with vomiting, unspecified: Secondary | ICD-10-CM

## 2020-11-16 DIAGNOSIS — Z09 Encounter for follow-up examination after completed treatment for conditions other than malignant neoplasm: Secondary | ICD-10-CM

## 2020-11-16 DIAGNOSIS — Z7952 Long term (current) use of systemic steroids: Secondary | ICD-10-CM

## 2020-11-16 HISTORY — PX: LAPAROSCOPIC INSERTION GASTROSTOMY TUBE: SHX6817

## 2020-11-16 SURGERY — INSERTION, GASTROSTOMY TUBE, PERCUTANEOUS
Anesthesia: General

## 2020-11-16 MED ORDER — ALPRAZOLAM 0.25 MG PO TABS
0.2500 mg | ORAL_TABLET | Freq: Every day | ORAL | Status: DC
  Administered 2020-11-18 – 2020-11-21 (×4): 0.25 mg via ORAL
  Filled 2020-11-16 (×5): qty 1

## 2020-11-16 MED ORDER — FENTANYL CITRATE (PF) 250 MCG/5ML IJ SOLN
INTRAMUSCULAR | Status: AC
  Filled 2020-11-16: qty 5

## 2020-11-16 MED ORDER — FENTANYL CITRATE (PF) 100 MCG/2ML IJ SOLN
25.0000 ug | INTRAMUSCULAR | Status: DC | PRN
  Administered 2020-11-16 (×4): 25 ug via INTRAVENOUS

## 2020-11-16 MED ORDER — SUGAMMADEX SODIUM 200 MG/2ML IV SOLN
INTRAVENOUS | Status: DC | PRN
  Administered 2020-11-16: 170 mg via INTRAVENOUS

## 2020-11-16 MED ORDER — FAMOTIDINE 20 MG PO TABS
40.0000 mg | ORAL_TABLET | Freq: Every day | ORAL | Status: DC
  Administered 2020-11-16 – 2020-11-17 (×2): 40 mg via ORAL
  Filled 2020-11-16 (×2): qty 2

## 2020-11-16 MED ORDER — CHLORHEXIDINE GLUCONATE CLOTH 2 % EX PADS: 6.0000 | MEDICATED_PAD | Freq: Once | CUTANEOUS | Status: DC

## 2020-11-16 MED ORDER — OXYCODONE HCL 5 MG PO TABS: 5.0000 mg | ORAL_TABLET | Freq: Once | ORAL | Status: DC | PRN

## 2020-11-16 MED ORDER — DEXMEDETOMIDINE (PRECEDEX) IN NS 20 MCG/5ML (4 MCG/ML) IV SYRINGE
PREFILLED_SYRINGE | INTRAVENOUS | Status: DC | PRN
  Administered 2020-11-16: 12 ug via INTRAVENOUS

## 2020-11-16 MED ORDER — LISINOPRIL 20 MG PO TABS
20.0000 mg | ORAL_TABLET | Freq: Every day | ORAL | Status: DC
  Administered 2020-11-18 – 2020-11-21 (×4): 20 mg via ORAL
  Filled 2020-11-16 (×6): qty 1

## 2020-11-16 MED ORDER — FENTANYL CITRATE (PF) 100 MCG/2ML IJ SOLN
INTRAMUSCULAR | Status: DC | PRN
  Administered 2020-11-16: 25 ug via INTRAVENOUS
  Administered 2020-11-16: 75 ug via INTRAVENOUS
  Administered 2020-11-16 (×3): 50 ug via INTRAVENOUS

## 2020-11-16 MED ORDER — FLUTICASONE PROPIONATE 50 MCG/ACT NA SUSP
2.0000 | Freq: Every day | NASAL | Status: DC | PRN
  Administered 2020-11-18 – 2020-11-20 (×2): 2 via NASAL
  Filled 2020-11-16 (×2): qty 16

## 2020-11-16 MED ORDER — ONDANSETRON HCL 4 MG/2ML IJ SOLN
4.0000 mg | Freq: Four times a day (QID) | INTRAMUSCULAR | Status: DC | PRN
  Administered 2020-11-16 – 2020-11-20 (×7): 4 mg via INTRAVENOUS
  Filled 2020-11-16 (×9): qty 2

## 2020-11-16 MED ORDER — PROPOFOL 10 MG/ML IV BOLUS
INTRAVENOUS | Status: AC
  Filled 2020-11-16: qty 20

## 2020-11-16 MED ORDER — AMISULPRIDE (ANTIEMETIC) 5 MG/2ML IV SOLN
INTRAVENOUS | Status: AC
  Filled 2020-11-16: qty 4

## 2020-11-16 MED ORDER — CEFAZOLIN SODIUM-DEXTROSE 2-3 GM-%(50ML) IV SOLR
INTRAVENOUS | Status: DC | PRN
  Administered 2020-11-16: 2 g via INTRAVENOUS

## 2020-11-16 MED ORDER — DIAZEPAM 5 MG PO TABS: 5.0000 mg | ORAL_TABLET | Freq: Four times a day (QID) | ORAL | Status: DC | PRN

## 2020-11-16 MED ORDER — METOPROLOL SUCCINATE ER 25 MG PO TB24
25.0000 mg | ORAL_TABLET | Freq: Every day | ORAL | Status: DC
  Administered 2020-11-18 – 2020-11-21 (×4): 25 mg via ORAL
  Filled 2020-11-16 (×6): qty 1

## 2020-11-16 MED ORDER — ACETAMINOPHEN 10 MG/ML IV SOLN
1000.0000 mg | Freq: Four times a day (QID) | INTRAVENOUS | Status: AC
  Administered 2020-11-16 – 2020-11-17 (×4): 1000 mg via INTRAVENOUS
  Filled 2020-11-16 (×4): qty 100

## 2020-11-16 MED ORDER — ALBUTEROL SULFATE HFA 108 (90 BASE) MCG/ACT IN AERS: 2.0000 | INHALATION_SPRAY | Freq: Four times a day (QID) | RESPIRATORY_TRACT | Status: DC | PRN

## 2020-11-16 MED ORDER — LACTATED RINGERS IV SOLN: INTRAVENOUS | Status: DC

## 2020-11-16 MED ORDER — PROPOFOL 10 MG/ML IV BOLUS
INTRAVENOUS | Status: DC | PRN
  Administered 2020-11-16: 140 mg via INTRAVENOUS

## 2020-11-16 MED ORDER — OXYCODONE HCL 5 MG/5ML PO SOLN: 5.0000 mg | Freq: Once | ORAL | Status: DC | PRN

## 2020-11-16 MED ORDER — ACETAMINOPHEN 500 MG PO TABS
1000.0000 mg | ORAL_TABLET | ORAL | Status: AC
  Administered 2020-11-16: 1000 mg via ORAL
  Filled 2020-11-16: qty 2

## 2020-11-16 MED ORDER — CEFAZOLIN SODIUM-DEXTROSE 2-4 GM/100ML-% IV SOLN
2.0000 g | INTRAVENOUS | Status: DC
  Filled 2020-11-16: qty 100

## 2020-11-16 MED ORDER — SCOPOLAMINE 1 MG/3DAYS TD PT72
MEDICATED_PATCH | TRANSDERMAL | Status: DC | PRN
  Administered 2020-11-16: 1 mg via TRANSDERMAL

## 2020-11-16 MED ORDER — AMISULPRIDE (ANTIEMETIC) 5 MG/2ML IV SOLN
10.0000 mg | Freq: Once | INTRAVENOUS | Status: AC | PRN
  Administered 2020-11-16: 10 mg via INTRAVENOUS

## 2020-11-16 MED ORDER — LIDOCAINE 2% (20 MG/ML) 5 ML SYRINGE
INTRAMUSCULAR | Status: DC | PRN
  Administered 2020-11-16: 60 mg via INTRAVENOUS

## 2020-11-16 MED ORDER — ROCURONIUM BROMIDE 10 MG/ML (PF) SYRINGE
PREFILLED_SYRINGE | INTRAVENOUS | Status: DC | PRN
  Administered 2020-11-16: 50 mg via INTRAVENOUS

## 2020-11-16 MED ORDER — PROPOFOL 1000 MG/100ML IV EMUL
INTRAVENOUS | Status: AC
  Filled 2020-11-16: qty 100

## 2020-11-16 MED ORDER — ESMOLOL HCL 100 MG/10ML IV SOLN
INTRAVENOUS | Status: DC | PRN
  Administered 2020-11-16: 20 mg via INTRAVENOUS

## 2020-11-16 MED ORDER — TRAMADOL HCL 50 MG PO TABS: 50.0000 mg | ORAL_TABLET | Freq: Four times a day (QID) | ORAL | 0 refills | Status: AC | PRN

## 2020-11-16 MED ORDER — ONDANSETRON HCL 4 MG/2ML IJ SOLN
INTRAMUSCULAR | Status: DC | PRN
  Administered 2020-11-16: 4 mg via INTRAVENOUS

## 2020-11-16 MED ORDER — PROMETHAZINE HCL 25 MG/ML IJ SOLN: 6.2500 mg | INTRAMUSCULAR | Status: DC | PRN

## 2020-11-16 MED ORDER — DEXAMETHASONE SODIUM PHOSPHATE 10 MG/ML IJ SOLN
INTRAMUSCULAR | Status: DC | PRN
  Administered 2020-11-16: 10 mg via INTRAVENOUS

## 2020-11-16 MED ORDER — DEXTROSE-NACL 5-0.9 % IV SOLN: INTRAVENOUS | Status: DC

## 2020-11-16 MED ORDER — PHENYLEPHRINE 40 MCG/ML (10ML) SYRINGE FOR IV PUSH (FOR BLOOD PRESSURE SUPPORT)
PREFILLED_SYRINGE | INTRAVENOUS | Status: DC | PRN
  Administered 2020-11-16: 80 ug via INTRAVENOUS
  Administered 2020-11-16: 40 ug via INTRAVENOUS
  Administered 2020-11-16: 80 ug via INTRAVENOUS
  Administered 2020-11-16: 40 ug via INTRAVENOUS

## 2020-11-16 MED ORDER — FENTANYL CITRATE (PF) 100 MCG/2ML IJ SOLN
INTRAMUSCULAR | Status: AC
  Filled 2020-11-16: qty 2

## 2020-11-16 MED ORDER — ACETAMINOPHEN 500 MG PO TABS
1000.0000 mg | ORAL_TABLET | Freq: Four times a day (QID) | ORAL | Status: DC | PRN
  Administered 2020-11-18 – 2020-11-19 (×2): 1000 mg via ORAL
  Filled 2020-11-16 (×4): qty 2

## 2020-11-16 MED ORDER — BUPIVACAINE-EPINEPHRINE (PF) 0.25% -1:200000 IJ SOLN
INTRAMUSCULAR | Status: AC
  Filled 2020-11-16: qty 30

## 2020-11-16 MED ORDER — SODIUM CHLORIDE 0.9 % IR SOLN
Status: DC | PRN
  Administered 2020-11-16: 1000 mL

## 2020-11-16 MED ORDER — SULFAMETHOXAZOLE-TRIMETHOPRIM 800-160 MG PO TABS: 1.0000 | ORAL_TABLET | Freq: Two times a day (BID) | ORAL | Status: DC

## 2020-11-16 MED ORDER — POLYETHYLENE GLYCOL 3350 17 G PO PACK
17.0000 g | PACK | Freq: Every day | ORAL | Status: DC
  Administered 2020-11-16 – 2020-11-21 (×4): 17 g via ORAL
  Filled 2020-11-16 (×5): qty 1

## 2020-11-16 MED ORDER — METOCLOPRAMIDE HCL 5 MG/ML IJ SOLN
10.0000 mg | Freq: Four times a day (QID) | INTRAMUSCULAR | Status: AC
  Administered 2020-11-16 – 2020-11-17 (×4): 10 mg via INTRAVENOUS
  Filled 2020-11-16 (×4): qty 2

## 2020-11-16 MED ORDER — EPHEDRINE SULFATE 50 MG/ML IJ SOLN
INTRAMUSCULAR | Status: DC | PRN
  Administered 2020-11-16 (×2): 10 mg via INTRAVENOUS

## 2020-11-16 MED ORDER — BUPIVACAINE-EPINEPHRINE 0.25% -1:200000 IJ SOLN
INTRAMUSCULAR | Status: DC | PRN
  Administered 2020-11-16: 1 mL

## 2020-11-16 MED ORDER — CHLORHEXIDINE GLUCONATE 0.12 % MT SOLN
15.0000 mL | Freq: Once | OROMUCOSAL | Status: AC
  Administered 2020-11-16: 15 mL via OROMUCOSAL
  Filled 2020-11-16: qty 15

## 2020-11-16 MED ORDER — ONDANSETRON 4 MG PO TBDP: 4.0000 mg | ORAL_TABLET | Freq: Four times a day (QID) | ORAL | Status: DC | PRN

## 2020-11-16 MED ORDER — ENSURE PRE-SURGERY PO LIQD: 296.0000 mL | Freq: Once | ORAL | Status: DC

## 2020-11-16 MED ORDER — ORAL CARE MOUTH RINSE: 15.0000 mL | Freq: Once | OROMUCOSAL | Status: AC

## 2020-11-16 SURGICAL SUPPLY — 52 items
ADH SKN CLS APL DERMABOND .7 (GAUZE/BANDAGES/DRESSINGS) ×1
APL PRP STRL LF DISP 70% ISPRP (MISCELLANEOUS) ×1
APL SKNCLS STERI-STRIP NONHPOA (GAUZE/BANDAGES/DRESSINGS) ×1
BAG URINE DRAINAGE (UROLOGICAL SUPPLIES) IMPLANT
BENZOIN TINCTURE PRP APPL 2/3 (GAUZE/BANDAGES/DRESSINGS) ×2 IMPLANT
BINDER ABDOMINAL 12 ML 46-62 (SOFTGOODS) ×2 IMPLANT
BLADE CLIPPER SURG (BLADE) IMPLANT
CANISTER SUCT 3000ML PPV (MISCELLANEOUS) IMPLANT
CATH DRAINAGE MALECOT 26FR (CATHETERS) IMPLANT
CATH MALECOT (CATHETERS)
CATH MALECOT BARD  24FR (CATHETERS)
CATH MALECOT BARD 24FR (CATHETERS) IMPLANT
CHLORAPREP W/TINT 26 (MISCELLANEOUS) ×2 IMPLANT
COVER SURGICAL LIGHT HANDLE (MISCELLANEOUS) ×2 IMPLANT
COVER WAND RF STERILE (DRAPES) ×2 IMPLANT
DERMABOND ADVANCED (GAUZE/BANDAGES/DRESSINGS) ×1
DERMABOND ADVANCED .7 DNX12 (GAUZE/BANDAGES/DRESSINGS) ×1 IMPLANT
DEVICE TROCAR PUNCTURE CLOSURE (ENDOMECHANICALS) ×2 IMPLANT
DRAPE LAPAROSCOPIC ABDOMINAL (DRAPES) ×2 IMPLANT
ELECT PENCIL ROCKER SW 15FT (MISCELLANEOUS) ×2 IMPLANT
ELECT REM PT RETURN 9FT ADLT (ELECTROSURGICAL) ×2
ELECTRODE REM PT RTRN 9FT ADLT (ELECTROSURGICAL) ×1 IMPLANT
GAUZE SPONGE 2X2 8PLY STRL LF (GAUZE/BANDAGES/DRESSINGS) ×1 IMPLANT
GLOVE BIO SURGEON STRL SZ7.5 (GLOVE) ×2 IMPLANT
GOWN STRL REUS W/ TWL LRG LVL3 (GOWN DISPOSABLE) ×2 IMPLANT
GOWN STRL REUS W/ TWL XL LVL3 (GOWN DISPOSABLE) ×1 IMPLANT
GOWN STRL REUS W/TWL LRG LVL3 (GOWN DISPOSABLE) ×4
GOWN STRL REUS W/TWL XL LVL3 (GOWN DISPOSABLE) ×2
KIT BASIN OR (CUSTOM PROCEDURE TRAY) ×2 IMPLANT
KIT TURNOVER KIT B (KITS) ×2 IMPLANT
LEGGING LITHOTOMY PAIR STRL (DRAPES) IMPLANT
NEEDLE INSUFFLATION 14GA 120MM (NEEDLE) ×2 IMPLANT
NS IRRIG 1000ML POUR BTL (IV SOLUTION) ×2 IMPLANT
PAD ARMBOARD 7.5X6 YLW CONV (MISCELLANEOUS) ×4 IMPLANT
PLUG CATH AND CAP STER (CATHETERS) IMPLANT
SCISSORS LAP 5X35 DISP (ENDOMECHANICALS) ×2 IMPLANT
SET IRRIG TUBING LAPAROSCOPIC (IRRIGATION / IRRIGATOR) IMPLANT
SET TUBE SMOKE EVAC HIGH FLOW (TUBING) ×2 IMPLANT
SLEEVE ENDOPATH XCEL 5M (ENDOMECHANICALS) ×4 IMPLANT
SPONGE DRAIN TRACH 4X4 STRL 2S (GAUZE/BANDAGES/DRESSINGS) ×2 IMPLANT
SPONGE GAUZE 2X2 STER 10/PKG (GAUZE/BANDAGES/DRESSINGS) ×1
SUT ETHILON 2 0 FS 18 (SUTURE) ×2 IMPLANT
SUT MNCRL AB 3-0 PS2 18 (SUTURE) ×2 IMPLANT
SUT SILK 2 0 SH (SUTURE) ×10 IMPLANT
TOWEL GREEN STERILE (TOWEL DISPOSABLE) ×2 IMPLANT
TOWEL GREEN STERILE FF (TOWEL DISPOSABLE) ×2 IMPLANT
TRAY LAPAROSCOPIC MC (CUSTOM PROCEDURE TRAY) ×2 IMPLANT
TROCAR XCEL NON-BLD 11X100MML (ENDOMECHANICALS) IMPLANT
TROCAR XCEL NON-BLD 5MMX100MML (ENDOMECHANICALS) ×2 IMPLANT
TUBE MOSS GAS 18FR (TUBING) ×2 IMPLANT
WARMER LAPAROSCOPE (MISCELLANEOUS) ×2 IMPLANT
WATER STERILE IRR 1000ML POUR (IV SOLUTION) ×2 IMPLANT

## 2020-11-16 NOTE — Progress Notes (Signed)
Patient continues to c/o nausea ad vomiting despite IV Zofran, Dr Derrell Lolling notified with new order.

## 2020-11-16 NOTE — Progress Notes (Signed)
Patient expressing concern regarding discharge home. Patient states that she is having a lot of pain and has a long drive home. This RN paged Derrell Lolling, MD to evaluate patient. MD to bedside to evaluate patient. MD instructed this RN to monitor patient for a while longer to see if patient improves. MD paged again to inform that patient is still expressing concern regarding discharge. MD states that he will admit the patient overnight for observation. Will continue to monitor.

## 2020-11-16 NOTE — Anesthesia Procedure Notes (Signed)
Procedure Name: Intubation Date/Time: 11/16/2020 8:49 AM Performed by: Stanton Kidney, CRNA Pre-anesthesia Checklist: Patient identified, Patient being monitored, Timeout performed, Emergency Drugs available and Suction available Patient Re-evaluated:Patient Re-evaluated prior to induction Oxygen Delivery Method: Circle system utilized Preoxygenation: Pre-oxygenation with 100% oxygen Induction Type: IV induction Ventilation: Mask ventilation without difficulty Laryngoscope Size: 3 and Miller Grade View: Grade I Tube type: Oral Tube size: 7.0 mm Number of attempts: 1 Airway Equipment and Method: Stylet Placement Confirmation: ETT inserted through vocal cords under direct vision,  positive ETCO2 and breath sounds checked- equal and bilateral Secured at: 21 cm Tube secured with: Tape Dental Injury: Teeth and Oropharynx as per pre-operative assessment

## 2020-11-16 NOTE — H&P (Signed)
History of Present Illness The patient is a 80 year old female who presents with a hiatal hernia. Patient is a 80 year old female, who comes in for follow-up for a large hiatal hernia. Patient did have a CT scan that was sent down. I was able to be. This did show a moderate size hiatal hernia. Patient continues with dysphagia and chest pain.  Patient's had clearance by Dr. Leavy Cella her pulmonologist.  -------------- Patient is a 80 year old female, who is referred by Dr. Leavy Cella, for evaluation of a large hiatal hernia. Patient has a history of COPD, chronic back pain, who comes in with a significant hiatal hernia. Patient was recently hospitalized [place] secondary to a fall, hiatal hernia. She does state that she had a COPD exacerbation as well. Patient states that she was known of the however hernia approximately fo4- 5 years. She states that she has had some dysphagia at times. She's had some reflux and regurgitation of food. Patient has had some chest pain as well. She states that she sleeps on one pillow it pulled that up.  Patient underwent CT scan which revealed a type IV hiatal hernia that contained her colon. I do not have those records currently.   Patient's had previous hysterectomy, laparoscopic cholecystectomy and appendectomy.  Patient currently sees Dr. Leavy Cella in [place] is her pulmonologist.    Medication History ALPRAZolam (0.25MG  Tablet, Oral) Active. Cefuroxime Axetil (250MG  Tablet, Oral) Active. dilTIAZem HCl ER Coated Beads (180MG  Capsule ER 24HR, Oral) Active. Doxycycline Hyclate (100MG  Tablet, Oral) Active. Ipratropium-Albuterol (0.5-2.5 (3)MG/3ML Solution, Inhalation) Active. Lisinopril (5MG  Tablet, Oral) Active. Metoprolol Succinate ER (25MG  Tablet ER 24HR, Oral) Active. Magnesium Oxide (400MG  Tablet, Oral) Active. Metoprolol Tartrate (50MG  Tablet, Oral) Active. FLUoxetine HCl (20MG  Capsule, Oral) Active. Famotidine (40MG   Tablet, Oral) Active. predniSONE (5MG  Tablet, Oral) Active. methylPREDNISolone (4MG  Tab Ther Pack, Oral) Active. Omeprazole (20MG  Capsule DR, Oral) Active. Vitamin D3 (1.25 MG(50000 UT) Capsule, Oral) Active. predniSONE (10MG  (48) Tab Ther Pack, Oral) Active. Klor-Con M20 ( Tablet ER, Oral) Active. Medications Reconciled    Review of Systems General Present- Fatigue. Not Present- Appetite Loss, Chills, Fever, Night Sweats, Weight Gain and Weight Loss. Skin Present- Dryness. Not Present- Change in Wart/Mole, Hives, Jaundice, New Lesions, Non-Healing Wounds, Rash and Ulcer. HEENT Present- Ringing in the Ears, Seasonal Allergies and Wears glasses/contact lenses. Not Present- Earache, Hearing Loss, Hoarseness, Nose Bleed, Oral Ulcers, Sinus Pain, Sore Throat, Visual Disturbances and Yellow Eyes. Respiratory Present- Difficulty Breathing and Wheezing. Not Present- Bloody sputum, Chronic Cough and Snoring. Breast Not Present- Breast Mass, Breast Pain, Nipple Discharge and Skin Changes. Cardiovascular Present- Leg Cramps and Shortness of Breath. Not Present- Chest Pain, Difficulty Breathing Lying Down, Palpitations, Rapid Heart Rate and Swelling of Extremities. Gastrointestinal Present- Bloating, Difficulty Swallowing, Excessive gas, Gets full quickly at meals and Indigestion. Not Present- Abdominal Pain, Bloody Stool, Change in Bowel Habits, Chronic diarrhea, Constipation, Hemorrhoids, Nausea, Rectal Pain and Vomiting. Female Genitourinary Not Present- Frequency, Nocturia, Painful Urination, Pelvic Pain and Urgency. Musculoskeletal Present- Back Pain, Joint Pain, Joint Stiffness, Muscle Pain and Muscle Weakness. Not Present- Swelling of Extremities. Neurological Present- Trouble walking and Weakness. Not Present- Decreased Memory, Fainting, Headaches, Numbness, Seizures, Tingling and Tremor. Psychiatric Present- Anxiety and Depression. Not Present- Bipolar, Change in Sleep Pattern,  Fearful and Frequent crying. Endocrine Not Present- Cold Intolerance, Excessive Hunger, Hair Changes, Heat Intolerance, Hot flashes and New Diabetes. Hematology Present- Easy Bruising. Not Present- Blood Thinners, Excessive bleeding, Gland problems, HIV and Persistent Infections. All other systems negative  Physical Exam The physical exam findings are as follows: Note: Constitutional: No acute distress, conversant, appears stated age  Eyes: Anicteric sclerae, moist conjunctiva, no lid lag  Neck: No thyromegaly, trachea midline, no cervical lymphadenopathy  Lungs: Clear to auscultation biilaterally, normal respiratory effot  Cardiovascular: regular rate & rhythm, no murmurs, no peripheal edema, pedal pulses 2+  GI: Soft, no masses or hepatosplenomegaly, non-tender to palpation  MSK: Normal gait, no clubbing cyanosis, edema  Skin: No rashes, palpation reveals normal skin turgor  Psychiatric: Appropriate judgment and insight, oriented to person, place, and time    Assessment & Plan HIATAL HERNIA (K44.9) Impression: Patient is a 80 year old female with moderate size hiatal hernia, dysphagia I long discussion with the patient and her daughter. I discussed with her the options of reduction of the hiatal hernia and G-tube placement. Also discussed the option of her hernia repair and fundoplication. Secondary to the patient's pulmonary issues she chose for the reduction of gastric anatomy, G-tube placement and gastropexy. I believe this will help relieve her symptoms and allow her to eat. I discussed with her that the G-tube would be in place for 3-4 months. This may cause some annoyance to her.  1. Will proceed to the operating room for laparoscopic gastropexy and gastric tube insertion. 2. I discussed with her the risks and benefits of the procedure to include but not limited to: Infection, bleeding, damage to structures, and possible recurrence. The patient was  understanding and wished to proceed.

## 2020-11-16 NOTE — Anesthesia Postprocedure Evaluation (Signed)
Anesthesia Post Note  Patient: Ariba Lehnen  Procedure(s) Performed: LAPAROSCOPIC GASTROPEXY AND INSERTION GASTROSTOMY TUBE (N/A )     Patient location during evaluation: PACU Anesthesia Type: General Level of consciousness: awake and alert Pain management: pain level controlled Vital Signs Assessment: post-procedure vital signs reviewed and stable Respiratory status: spontaneous breathing, nonlabored ventilation, respiratory function stable and patient connected to nasal cannula oxygen Cardiovascular status: blood pressure returned to baseline and stable Postop Assessment: no apparent nausea or vomiting Anesthetic complications: no   No complications documented.  Last Vitals:  Vitals:   11/16/20 1324 11/16/20 2026  BP: (!) 172/75 (!) 161/76  Pulse: 85 87  Resp: 18 18  Temp:  36.5 C  SpO2: 97% 95%    Last Pain:  Vitals:   11/16/20 2026  TempSrc: Oral  PainSc:                  Nelle Don Faydra Korman

## 2020-11-16 NOTE — Transfer of Care (Signed)
Immediate Anesthesia Transfer of Care Note  Patient: Erica Sanders  Procedure(s) Performed: LAPAROSCOPIC GASTROPEXY AND INSERTION GASTROSTOMY TUBE (N/A )  Patient Location: PACU  Anesthesia Type:General  Level of Consciousness: awake, oriented and drowsy  Airway & Oxygen Therapy: Patient Spontanous Breathing  Post-op Assessment: Report given to RN, Post -op Vital signs reviewed and stable and Patient moving all extremities  Post vital signs: stable  Last Vitals:  Vitals Value Taken Time  BP 153/69 11/16/20 1005  Temp 36.6 C 11/16/20 1005  Pulse 78 11/16/20 1008  Resp 13 11/16/20 1007  SpO2 91 % 11/16/20 1008  Vitals shown include unvalidated device data.  Last Pain:  Vitals:   11/16/20 1005  PainSc: Asleep         Complications: No complications documented.

## 2020-11-16 NOTE — Discharge Instructions (Signed)
Gastrostomy Tube, Care After This sheet gives you information about how to care for yourself after your procedure. Your health care provider may also give you more specific instructions. If you have problems or questions, contact your health care provider. What can I expect after the procedure? After the procedure, it is common to have:  Mild pain in your abdomen.  A small amount of blood-colored fluid leaking from the site of your gastrostomy tube (G-tube) replacement. Follow these instructions at home:  Return to your normal activities as told by your health care provider.  You may return to your normal feedings.  Wash your hands for at least 20 seconds before and after caring for your G-tube.  Check the skin around your tube insertion site for: ? Redness. ? Irritation. ? Swelling. ? Drainage. ? Extra tissue growth.  Care for your G-tube as you did before, or as told by your health care provider.  Keep all follow-up visits. This is important.   Contact a health care provider if:  You have a fever or chills.  You see any of these signs on the skin near your insertion site: ? Redness. ? Irritation. ? Swelling. ? Drainage. ? Extra tissue growth.  You continue to have pain in the abdomen or leaking around your G-tube. Get help right away if:  You develop bleeding or a lot of discharge around the tube.  You have severe pain in the abdomen.  Your new tube is not working properly.  You are unable to get feedings into the tube.  Your tube comes out for any reason. Summary  After the procedure, it is common to have mild pain in your abdomen and a small amount of blood-colored fluid.  Return to your normal activities and feedings as told by your health care provider.  Care for your gastrostomy tube, or G-tube, as you did before, or as told by your health care provider. This information is not intended to replace advice given to you by your health care provider. Make  sure you discuss any questions you have with your health care provider. Document Revised: 10/22/2019 Document Reviewed: 10/22/2019 Elsevier Patient Education  2021 [employer].

## 2020-11-16 NOTE — Op Note (Addendum)
11/16/2020  9:44 AM  PATIENT:  Erica Sanders  80 y.o. female  PRE-OPERATIVE DIAGNOSIS:  HIATAL HERNIA, DYSPHAGIA  POST-OPERATIVE DIAGNOSIS:  HIATAL HERNIA, DYSPHAGIA  PROCEDURE:  Procedure(s): LAPAROSCOPIC GASTROPEXY AND INSERTION GASTROSTOMY TUBE (N/A)  SURGEON:  Surgeon(s) and Role:    Axel Filler, MD - Primary  ASSISTANTS: Lissa Morales, MD PGY-4 , who was essential at assisting with lap Gtube manipulation, and suturing.  ANESTHESIA:   local and general  EBL:  minimal   BLOOD ADMINISTERED:none  DRAINS: Gastrostomy tube   LOCAL MEDICATIONS USED:  BUPIVICAINE   SPECIMEN:  No Specimen  DISPOSITION OF SPECIMEN:  N/A  COUNTS:  YES  TOURNIQUET:  * No tourniquets in log *  DICTATION: .Dragon Dictation  Patient is a 80 year old female, with a history of a significant type IV hiatal hernia, dysphagia, reflux.  Patient with significant pulmonary issues.  Patient was counseled in clinic.  Patient decided to have elective gastrostomy tube placement and gastropexy to help with her symptoms.  Details of procedure: After the patient was consented she was taken back to the OR placed in supine position with bilateral SCDs in place.  She underwent general endotracheal intubation.  A timeout was called and all facts were verified.  A verress needle was used to insufflate the abdomen to 45mm of mercury in the right lower quadrant.  This was followed by a 57mm trocar and camera.  There was no injury to any intraabdominal organ.   THe hiatal hernia was easily visualized.  A 84mm trocar was than placed at the umbilicus.  A second 81mm trocar was than placed in the left lower quadrant under direct visualization.  At this time the hernia contents were reduced.  There is significant amount of colon into the chest cavity.  There was some thin adhesions to the left hiatus.  These were incised with cautery.  This allowed the colon to easily be reduced into the abdominal cavity.  At this time a  2-0 silk was used to create a gastropexy at the greater curvature of the stomach.  This was laparoscopically sutured to the anterior abdominal wall.  At this time a portion of the greater curvature of stomach was brought up to the anterior abdominal wall to check for placement.  2 -0 silks were used at the 3:00, 9:00, 6:00, 12:00 portions of the selected site of the gastrostomy tube.  A gastrotomy site was than made with the hook cautery.  An incision was made in the skin and a Kelly clamp was used to introduce the g-tube into the stomach.  The balloon was filled with sterile saline.  The 2-0 silks were than brought up through the abdominal wall via an Endoclose device and tied down. The balloon was brought up to the make the stomach snug to the anterior abdominal wall.  This was all done under direct visualization.  The bolster was snug.  It was secured in place using a 2-0 nylon x 3.  The insuflation was evacuated and all trocars were removed.  The trocar sites were than closed with 4-0 monocryl.  The skin was dressed with steristrips and guaze and tape.  I was personally present during the key and critical portions of this procedure and immediately available throughout the entire procedure, as documented in my operative note.   PLAN OF CARE: Discharge to home after PACU  PATIENT DISPOSITION:  PACU - hemodynamically stable.   Delay start of Pharmacological VTE agent (>24hrs) due to surgical blood loss  or risk of bleeding: not applicable

## 2020-11-17 ENCOUNTER — Encounter (HOSPITAL_COMMUNITY): Payer: Self-pay | Admitting: General Surgery

## 2020-11-17 MED ORDER — SODIUM CHLORIDE 0.9 % IV SOLN
12.5000 mg | INTRAVENOUS | Status: DC | PRN
  Administered 2020-11-17 – 2020-11-19 (×2): 12.5 mg via INTRAVENOUS
  Filled 2020-11-17 (×2): qty 0.5

## 2020-11-17 NOTE — Plan of Care (Signed)

## 2020-11-17 NOTE — Progress Notes (Signed)
1 Day Post-Op   Subjective/Chief Complaint: PT with con't pain and nausea.  "Can't hold anything down."   Objective: Vital signs in last 24 hours: Temp:  [97.7 F (36.5 C)-98.2 F (36.8 C)] 98.2 F (36.8 C) (06/02 0538) Pulse Rate:  [63-96] 96 (06/02 0538) Resp:  [12-22] 18 (06/02 0538) BP: (142-172)/(63-77) 166/65 (06/02 0538) SpO2:  [93 %-100 %] 100 % (06/02 0538) Weight:  [84.8 kg] 84.8 kg (06/01 0658)    Intake/Output from previous day: 06/01 0701 - 06/02 0700 In: 1881.6 [I.V.:1781.6; IV Piggyback:100] Out: 840 [Urine:825; Blood:15] Intake/Output this shift: Total I/O In: 1081.6 [I.V.:981.6; IV Piggyback:100] Out: 825 [Urine:825]  General appearance: alert and cooperative GI: soft, non-tender; bowel sounds normal; no masses,  no organomegaly and G-tube in place  Lab Results:  Recent Labs    11/15/20 1500  WBC 9.2  HGB 15.6*  HCT 48.8*  PLT 254   BMET Recent Labs    11/15/20 1500  NA 137  K 5.5*  CL 103  CO2 26  GLUCOSE 118*  BUN 20  CREATININE 1.59*  CALCIUM 9.9   PT/INR No results for input(s): LABPROT, INR in the last 72 hours. ABG No results for input(s): PHART, HCO3 in the last 72 hours.  Invalid input(s): PCO2, PO2  Studies/Results: No results found.  Anti-infectives: Anti-infectives (From admission, onward)   Start     Dose/Rate Route Frequency Ordered Stop   11/16/20 1430  sulfamethoxazole-trimethoprim (BACTRIM DS) 800-160 MG per tablet 1 tablet  Status:  Discontinued        1 tablet Oral 2 times daily 11/16/20 1336 11/16/20 1350   11/16/20 0645  ceFAZolin (ANCEF) IVPB 2g/100 mL premix  Status:  Discontinued        2 g 200 mL/hr over 30 Minutes Intravenous On call to O.R. 11/16/20 6812 11/16/20 1315      Assessment/Plan: s/p Procedure(s): LAPAROSCOPIC GASTROPEXY AND INSERTION GASTROSTOMY TUBE (N/A) Advance diet as tol Con't with reglan for nausea Mobilize Home tomorrow if doing well  LOS: 0 days    Axel Filler 11/17/2020

## 2020-11-18 DIAGNOSIS — Z7952 Long term (current) use of systemic steroids: Secondary | ICD-10-CM | POA: Diagnosis not present

## 2020-11-18 DIAGNOSIS — Z79899 Other long term (current) drug therapy: Secondary | ICD-10-CM | POA: Diagnosis not present

## 2020-11-18 DIAGNOSIS — K219 Gastro-esophageal reflux disease without esophagitis: Secondary | ICD-10-CM | POA: Diagnosis present

## 2020-11-18 DIAGNOSIS — Z9049 Acquired absence of other specified parts of digestive tract: Secondary | ICD-10-CM | POA: Diagnosis not present

## 2020-11-18 DIAGNOSIS — Z90710 Acquired absence of both cervix and uterus: Secondary | ICD-10-CM | POA: Diagnosis not present

## 2020-11-18 DIAGNOSIS — R1310 Dysphagia, unspecified: Secondary | ICD-10-CM | POA: Diagnosis present

## 2020-11-18 DIAGNOSIS — J449 Chronic obstructive pulmonary disease, unspecified: Secondary | ICD-10-CM | POA: Diagnosis present

## 2020-11-18 DIAGNOSIS — Z20822 Contact with and (suspected) exposure to covid-19: Secondary | ICD-10-CM | POA: Diagnosis present

## 2020-11-18 DIAGNOSIS — K449 Diaphragmatic hernia without obstruction or gangrene: Secondary | ICD-10-CM | POA: Diagnosis present

## 2020-11-18 MED ORDER — FAMOTIDINE 20 MG PO TABS
40.0000 mg | ORAL_TABLET | Freq: Two times a day (BID) | ORAL | Status: DC
  Administered 2020-11-18 – 2020-11-21 (×7): 40 mg via ORAL
  Filled 2020-11-18 (×7): qty 2

## 2020-11-18 MED ORDER — SODIUM CHLORIDE 0.9 % IV SOLN
12.5000 mg | Freq: Four times a day (QID) | INTRAVENOUS | Status: AC
  Administered 2020-11-18 (×3): 12.5 mg via INTRAVENOUS
  Filled 2020-11-18 (×4): qty 0.5

## 2020-11-18 MED ORDER — METOCLOPRAMIDE HCL 5 MG/ML IJ SOLN
10.0000 mg | Freq: Four times a day (QID) | INTRAMUSCULAR | Status: AC
  Administered 2020-11-18 – 2020-11-19 (×4): 10 mg via INTRAVENOUS
  Filled 2020-11-18 (×4): qty 2

## 2020-11-18 NOTE — Progress Notes (Signed)
Clamped G-tube and removed Foley bag. 425 mL clear, brown fluid.  Pt tolerated small soft lunch well.  Pt asked for flonase. She said she thinks that might be the source of the nausea when she swallows it or tries to hack it up. Messaged PA for surgical.

## 2020-11-18 NOTE — Progress Notes (Signed)
Pt did not have any episodes of vomiting today.  She only ate a few bites of breakfast, a few bites of lunch, and a few bites of vanilla pudding.  Her only complaint of pain is a burning below her umbilicus that comes and goes.

## 2020-11-18 NOTE — Progress Notes (Signed)
2 Days Post-Op   Subjective/Chief Complaint: Pt with con't nausea and min PO    Objective: Vital signs in last 24 hours: Temp:  [97.8 F (36.6 C)-98.2 F (36.8 C)] 98.2 F (36.8 C) (06/03 0455) Pulse Rate:  [96-109] 109 (06/03 0455) Resp:  [16-18] 16 (06/03 0455) BP: (157-163)/(60-97) 163/97 (06/03 0455) SpO2:  [96 %-99 %] 96 % (06/03 0455) Last BM Date: 11/16/20  Intake/Output from previous day: 06/02 0701 - 06/03 0700 In: 683.6 [I.V.:633.6; IV Piggyback:50] Out: 350 [Urine:350] Intake/Output this shift: No intake/output data recorded.  General appearance: alert and cooperative GI: soft, non-tender; bowel sounds normal; no masses,  no organomegaly and inc c/d/i, gtube in place  Lab Results:  Recent Labs    11/15/20 1500  WBC 9.2  HGB 15.6*  HCT 48.8*  PLT 254   BMET Recent Labs    11/15/20 1500  NA 137  K 5.5*  CL 103  CO2 26  GLUCOSE 118*  BUN 20  CREATININE 1.59*  CALCIUM 9.9   PT/INR No results for input(s): LABPROT, INR in the last 72 hours. ABG No results for input(s): PHART, HCO3 in the last 72 hours.  Invalid input(s): PCO2, PO2  Studies/Results: No results found.  Anti-infectives: Anti-infectives (From admission, onward)   Start     Dose/Rate Route Frequency Ordered Stop   11/16/20 1430  sulfamethoxazole-trimethoprim (BACTRIM DS) 800-160 MG per tablet 1 tablet  Status:  Discontinued        1 tablet Oral 2 times daily 11/16/20 1336 11/16/20 1350   11/16/20 0645  ceFAZolin (ANCEF) IVPB 2g/100 mL premix  Status:  Discontinued        2 g 200 mL/hr over 30 Minutes Intravenous On call to O.R. 11/16/20 0633 11/16/20 1315      Assessment/Plan: s/p Procedure(s): LAPAROSCOPIC GASTROPEXY AND INSERTION GASTROSTOMY TUBE (N/A) Advance diet to soft Cont' with nausea rx Will place g-tube to gravity to see if that helps with her nausea Will schedule phenergan & reglan to help with nausea Hopefully home tomorrow if doing well  LOS: 0 days     Axel Filler 11/18/2020

## 2020-11-18 NOTE — Progress Notes (Signed)
Patient has was nauseous last night but relieved by IV Zofran and Phenergan, denies having pain. This morning patient again complained of nausea and belching but refused medication. She said she is not sure if she can have breakfast today. Dressing was soiled with brownish liquid from the G-tube insertion site, changed dressing and abdominal binder. Able to mobilize to BR to void with standby assistance.

## 2020-11-18 NOTE — Progress Notes (Signed)
Attached Foley bag to G-tube per verbal order from Dr. Derrell Lolling on morning rounds. Drainage is clear brown.

## 2020-11-18 NOTE — Progress Notes (Signed)
Changed G-tube dressing

## 2020-11-19 ENCOUNTER — Inpatient Hospital Stay (HOSPITAL_COMMUNITY): Payer: Medicare Other

## 2020-11-19 LAB — BASIC METABOLIC PANEL
Anion gap: 13 (ref 5–15)
BUN: 15 mg/dL (ref 8–23)
CO2: 21 mmol/L — ABNORMAL LOW (ref 22–32)
Calcium: 9 mg/dL (ref 8.9–10.3)
Chloride: 103 mmol/L (ref 98–111)
Creatinine, Ser: 0.73 mg/dL (ref 0.44–1.00)
GFR, Estimated: >60 mL/min (ref >60)
Glucose, Bld: 148 mg/dL — ABNORMAL HIGH (ref 70–99)
Potassium: 4.2 mmol/L (ref 3.5–5.1)
Sodium: 137 mmol/L (ref 135–145)

## 2020-11-19 LAB — URINALYSIS, ROUTINE W REFLEX MICROSCOPIC
Bilirubin Urine: NEGATIVE
Glucose, UA: NEGATIVE mg/dL
Ketones, ur: NEGATIVE mg/dL
Leukocytes,Ua: NEGATIVE
Nitrite: NEGATIVE
Protein, ur: 100 mg/dL — AB
Specific Gravity, Urine: 1.023 (ref 1.005–1.030)
pH: 6 (ref 5.0–8.0)

## 2020-11-19 LAB — CBC
HCT: 43.7 % (ref 36.0–46.0)
Hemoglobin: 14.5 g/dL (ref 12.0–15.0)
MCH: 28.4 pg (ref 26.0–34.0)
MCHC: 33.2 g/dL (ref 30.0–36.0)
MCV: 85.5 fL (ref 80.0–100.0)
Platelets: 163 10*3/uL (ref 150–400)
RBC: 5.11 MIL/uL (ref 3.87–5.11)
RDW: 14.5 % (ref 11.5–15.5)
WBC: 20.5 10*3/uL — ABNORMAL HIGH (ref 4.0–10.5)
nRBC: 0 % (ref 0.0–0.2)

## 2020-11-19 MED ORDER — DOCUSATE SODIUM 100 MG PO CAPS
100.0000 mg | ORAL_CAPSULE | Freq: Two times a day (BID) | ORAL | Status: DC
  Administered 2020-11-19 – 2020-11-21 (×5): 100 mg via ORAL
  Filled 2020-11-19 (×5): qty 1

## 2020-11-19 MED ORDER — SODIUM CHLORIDE 0.9 % IV SOLN
6.2500 mg | INTRAVENOUS | Status: DC | PRN
  Filled 2020-11-19: qty 0.25

## 2020-11-19 NOTE — Progress Notes (Signed)
   Progress Note  3 Days Post-Op  Subjective: Patient reports feeling "crummy" this AM. She reports some intermittent burning pain in chest. She is passing flatus but no BM. She reports not having much appetite. I spoke with her daughter on the phone who reports patient lives alone but she was planning on staying a few days with her upon discharge.  Objective: Vital signs in last 24 hours: Temp:  [98.1 F (36.7 C)-98.4 F (36.9 C)] 98.4 F (36.9 C) (06/04 0526) Pulse Rate:  [91-110] 104 (06/04 0526) Resp:  [16-18] 18 (06/04 0526) BP: (156-165)/(59-90) 156/90 (06/04 0526) SpO2:  [96 %-99 %] 99 % (06/04 0526) Last BM Date: 11/16/20  Intake/Output from previous day: 06/03 0701 - 06/04 0700 In: -  Out: 300 [Urine:300] Intake/Output this shift: No intake/output data recorded.  PE: General appearance: alert and cooperative GI: soft, non-tender; bowel sounds normal; no masses,  no organomegaly and inc c/d/i, gtube in place   Lab Results:  No results for input(s): WBC, HGB, HCT, PLT in the last 72 hours. BMET No results for input(s): NA, K, CL, CO2, GLUCOSE, BUN, CREATININE, CALCIUM in the last 72 hours. PT/INR No results for input(s): LABPROT, INR in the last 72 hours. CMP     Component Value Date/Time   NA 137 11/15/2020 1500   K 5.5 (H) 11/15/2020 1500   CL 103 11/15/2020 1500   CO2 26 11/15/2020 1500   GLUCOSE 118 (H) 11/15/2020 1500   BUN 20 11/15/2020 1500   CREATININE 1.59 (H) 11/15/2020 1500   CALCIUM 9.9 11/15/2020 1500   GFRNONAA 33 (L) 11/15/2020 1500   GFRAA >60 06/25/2018 1145   Lipase  No results found for: LIPASE     Studies/Results: No results found.  Anti-infectives: Anti-infectives (From admission, onward)   Start     Dose/Rate Route Frequency Ordered Stop   11/16/20 1430  sulfamethoxazole-trimethoprim (BACTRIM DS) 800-160 MG per tablet 1 tablet  Status:  Discontinued        1 tablet Oral 2 times daily 11/16/20 1336 11/16/20 1350   11/16/20  0645  ceFAZolin (ANCEF) IVPB 2g/100 mL premix  Status:  Discontinued        2 g 200 mL/hr over 30 Minutes Intravenous On call to O.R. 11/16/20 5631 11/16/20 1315       Assessment/Plan s/p Procedure(s): LAPAROSCOPIC GASTROPEXY AND INSERTION GASTROSTOMY TUBE (N/A) On soft diet Had some n/v yesterday - check KUB this AM Cont' with nausea rx G-tube to gravity to see if that helps with her nausea Decrease scheduled phenergan with confusion this AM. Continue reglan  PT eval  Discussed with patient's daughter this AM on the phone Possible discharge tomorrow if doing ok   LOS: 1 day    Juliet Rude, [facility] 11/19/2020, 8:09 AM Please see Amion for pager number during day hours 7:00am-4:30pm

## 2020-11-20 LAB — BASIC METABOLIC PANEL
Anion gap: 7 (ref 5–15)
BUN: 16 mg/dL (ref 8–23)
CO2: 26 mmol/L (ref 22–32)
Calcium: 8.8 mg/dL — ABNORMAL LOW (ref 8.9–10.3)
Chloride: 104 mmol/L (ref 98–111)
Creatinine, Ser: 0.75 mg/dL (ref 0.44–1.00)
GFR, Estimated: >60 mL/min (ref >60)
Glucose, Bld: 129 mg/dL — ABNORMAL HIGH (ref 70–99)
Potassium: 3.5 mmol/L (ref 3.5–5.1)
Sodium: 137 mmol/L (ref 135–145)

## 2020-11-20 LAB — CBC
HCT: 39.2 % (ref 36.0–46.0)
Hemoglobin: 12.6 g/dL (ref 12.0–15.0)
MCH: 28.2 pg (ref 26.0–34.0)
MCHC: 32.1 g/dL (ref 30.0–36.0)
MCV: 87.7 fL (ref 80.0–100.0)
Platelets: 219 10*3/uL (ref 150–400)
RBC: 4.47 MIL/uL (ref 3.87–5.11)
RDW: 14.5 % (ref 11.5–15.5)
WBC: 12.8 10*3/uL — ABNORMAL HIGH (ref 4.0–10.5)
nRBC: 0 % (ref 0.0–0.2)

## 2020-11-20 MED ORDER — GUAIFENESIN ER 600 MG PO TB12
600.0000 mg | ORAL_TABLET | Freq: Two times a day (BID) | ORAL | Status: DC
  Administered 2020-11-20 – 2020-11-21 (×3): 600 mg via ORAL
  Filled 2020-11-20 (×3): qty 1

## 2020-11-20 MED ORDER — SUCRALFATE 1 GM/10ML PO SUSP
1.0000 g | Freq: Three times a day (TID) | ORAL | Status: DC
  Administered 2020-11-20 – 2020-11-21 (×4): 1 g via ORAL
  Filled 2020-11-20 (×5): qty 10

## 2020-11-20 NOTE — Progress Notes (Signed)
No complains of nausea all night but pt complains of gas reflux making her throat sore.  Old blood drainage still noted from the Gtube site. Dressing changed.

## 2020-11-20 NOTE — Progress Notes (Signed)
Progress Note  4 Days Post-Op  Subjective: Patient denies pain. Reports feeling better than yesterday but still nauseated and not taking in much. Complains of burning with eating. Denies dysuria.   Objective: Vital signs in last 24 hours: Temp:  [97.9 F (36.6 C)-99.5 F (37.5 C)] 99.5 F (37.5 C) (06/05 0427) Pulse Rate:  [83-87] 85 (06/05 0500) Resp:  [16-17] 16 (06/05 0427) BP: (158-176)/(67-73) 168/71 (06/05 0500) SpO2:  [95 %-100 %] 95 % (06/05 0427) Last BM Date: 11/16/20  Intake/Output from previous day: 06/04 0701 - 06/05 0700 In: 240 [P.O.:240] Out: 500 [Urine:500] Intake/Output this shift: No intake/output data recorded.  PE: General appearance:alert and cooperative HE:RDEY, non-tender; bowel sounds normal; no masses, no organomegaly and inc c/d/i, gtube in place   Lab Results:  Recent Labs    11/19/20 0828 11/20/20 0014  WBC 20.5* 12.8*  HGB 14.5 12.6  HCT 43.7 39.2  PLT 163 219   BMET Recent Labs    11/19/20 0828 11/20/20 0014  NA 137 137  K 4.2 3.5  CL 103 104  CO2 21* 26  GLUCOSE 148* 129*  BUN 15 16  CREATININE 0.73 0.75  CALCIUM 9.0 8.8*   PT/INR No results for input(s): LABPROT, INR in the last 72 hours. CMP     Component Value Date/Time   NA 137 11/20/2020 0014   K 3.5 11/20/2020 0014   CL 104 11/20/2020 0014   CO2 26 11/20/2020 0014   GLUCOSE 129 (H) 11/20/2020 0014   BUN 16 11/20/2020 0014   CREATININE 0.75 11/20/2020 0014   CALCIUM 8.8 (L) 11/20/2020 0014   GFRNONAA >60 11/20/2020 0014   GFRAA >60 06/25/2018 1145   Lipase  No results found for: LIPASE     Studies/Results: DG CHEST PORT 1 VIEW  Result Date: 11/19/2020 CLINICAL DATA:  Chest pain EXAM: PORTABLE CHEST 1 VIEW COMPARISON:  Lumbar radiograph January 26, 2020 FINDINGS: Suspected large hiatal type hernia. Bibasilar atelectasis. No edema or airspace opacity. Heart size within normal limits. Pulmonary vascular is normal. No adenopathy. There is aortic  atherosclerosis. No bone lesions. IMPRESSION: Apparent large hiatal type hernia with most of stomach above the diaphragm. Bibasilar atelectasis. No edema or airspace opacity. Heart size grossly normal. Aortic Atherosclerosis (ICD10-I70.0). Electronically Signed   By: Bretta Bang III M.D.   On: 11/19/2020 10:42   DG Abd Portable 1V  Result Date: 11/19/2020 CLINICAL DATA:  Abdominal pain with nausea and vomiting EXAM: PORTABLE ABDOMEN - 1 VIEW COMPARISON:  None. FINDINGS: Gastrostomy catheter overlies the mid abdomen. No bowel dilatation or air-fluid level to suggest bowel obstruction. No evident free air on supine examination. Moderate stool in colon. Surgical clips gallbladder fossa region. IMPRESSION: Gastrostomy catheter overlies mid abdomen. Its precise location cannot be established on this supine examination. If there is concern for catheter positioning, contrast administration via the catheter with imaging may be a reasonable consideration. No bowel obstruction or free air evident on supine examination. Electronically Signed   By: Bretta Bang III M.D.   On: 11/19/2020 10:40    Anti-infectives: Anti-infectives (From admission, onward)   Start     Dose/Rate Route Frequency Ordered Stop   11/16/20 1430  sulfamethoxazole-trimethoprim (BACTRIM DS) 800-160 MG per tablet 1 tablet  Status:  Discontinued        1 tablet Oral 2 times daily 11/16/20 1336 11/16/20 1350   11/16/20 0645  ceFAZolin (ANCEF) IVPB 2g/100 mL premix  Status:  Discontinued  2 g 200 mL/hr over 30 Minutes Intravenous On call to O.R. 11/16/20 0633 11/16/20 1315       Assessment/Plan s/pProcedure(s): LAPAROSCOPIC GASTROPEXY AND INSERTION GASTROSTOMY TUBE (N/A) On soft diet Still having some nausea and vomiting but would prefer to limit phenergan with confusion yesterday - try adding carafate today  G-tube to gravity to see if that helps with her nausea Add mucinex for phlegm UA equivocal and WBC coming back  down to 12 from 20, recheck in AM PT eval  Dr. Derrell Lolling to resume care tomorrow, would favor keeping patient today and rechecking in the AM  LOS: 2 days    Juliet Rude, [facility] 11/20/2020, 9:30 AM Please see Amion for pager number during day hours 7:00am-4:30pm

## 2020-11-20 NOTE — Evaluation (Signed)
Physical Therapy Evaluation Patient Details Name: Erica Sanders MRN: [id number] DOB: 08/31/1940 Today's Date: 11/20/2020   History of Present Illness  Pt is a 80 y.o. F who presents with a large hiatal hernia s/p laparoscopic gastropexy and insertion G tube. No significant PMH.  Clinical Impression  PTA, pt lives alone, uses a quad cane for mobility and is independent with ADL's. Pt presents with decreased functional mobility secondary to weakness,abdominal pain, and decreased endurance. Ambulating x 100 feet with a walker at a min guard assist level. Education provided regarding log roll technique, activity recommendations, pillow splinting for comfort. See below for recommendations.     Follow Up Recommendations Home health PT    Equipment Recommendations  Cane    Recommendations for Other Services       Precautions / Restrictions Precautions Precautions: Fall;Other (comment) Precaution Comments: G tube Restrictions Weight Bearing Restrictions: No      Mobility  Bed Mobility               General bed mobility comments: OOB in chair    Transfers Overall transfer level: Needs assistance Equipment used: Rolling walker (2 wheeled) Transfers: Sit to/from Stand Sit to Stand: Min guard            Ambulation/Gait Ambulation/Gait assistance: Supervision Gait Distance (Feet): 100 Feet Assistive device: Rolling walker (2 wheeled) Gait Pattern/deviations: Step-through pattern;Decreased stride length;Trunk flexed Gait velocity: decreased Gait velocity interpretation: <1.8 ft/sec, indicate of risk for recurrent falls General Gait Details: Cues for walker proximity, slow and steady pace  Stairs            Wheelchair Mobility    Modified Rankin (Stroke Patients Only)       Balance Overall balance assessment: Needs assistance Sitting-balance support: Feet supported Sitting balance-Leahy Scale: Good     Standing balance support: Bilateral upper extremity  supported Standing balance-Leahy Scale: Poor Standing balance comment: reliant on external support                             Pertinent Vitals/Pain Pain Assessment: Faces Faces Pain Scale: Hurts even more Pain Location: abdomen Pain Descriptors / Indicators: Burning Pain Intervention(s): Monitored during session    Home Living Family/patient expects to be discharged to:: Private residence Living Arrangements: Alone Available Help at Discharge: Family Type of Home: House Home Access: Stairs to enter   Secretary/administrator of Steps: 2 Home Layout: One level Home Equipment: Environmental consultant - 2 wheels;Cane - quad;Bedside commode;Shower seat      Prior Function Level of Independence: Independent with assistive device(s)         Comments: Uses quad cane, limited community ambulator     Hand Dominance        Extremity/Trunk Assessment   Upper Extremity Assessment Upper Extremity Assessment: Generalized weakness    Lower Extremity Assessment Lower Extremity Assessment: Generalized weakness    Cervical / Trunk Assessment Cervical / Trunk Assessment: Kyphotic  Communication   Communication: No difficulties  Cognition Arousal/Alertness: Awake/alert Behavior During Therapy: WFL for tasks assessed/performed Overall Cognitive Status: Within Functional Limits for tasks assessed                                        General Comments      Exercises     Assessment/Plan    PT Assessment Patient needs continued PT services  PT Problem List Decreased strength;Decreased activity tolerance;Decreased balance;Pain       PT Treatment Interventions DME instruction;Gait training;Stair training;Functional mobility training;Therapeutic activities;Therapeutic exercise;Balance training;Patient/family education    PT Goals (Current goals can be found in the Care Plan section)  Acute Rehab PT Goals Patient Stated Goal: less pain, be able to "go out and  do stuff." PT Goal Formulation: With patient Time For Goal Achievement: 12/04/20 Potential to Achieve Goals: Good    Frequency Min 3X/week   Barriers to discharge        Co-evaluation               AM-PAC PT "6 Clicks" Mobility  Outcome Measure Help needed turning from your back to your side while in a flat bed without using bedrails?: A Little Help needed moving from lying on your back to sitting on the side of a flat bed without using bedrails?: A Little Help needed moving to and from a bed to a chair (including a wheelchair)?: A Little Help needed standing up from a chair using your arms (e.g., wheelchair or bedside chair)?: A Little Help needed to walk in hospital room?: A Little Help needed climbing 3-5 steps with a railing? : A Little 6 Click Score: 18    End of Session   Activity Tolerance: Patient tolerated treatment well Patient left: in chair;with call bell/phone within reach;with family/visitor present   PT Visit Diagnosis: Pain;Difficulty in walking, not elsewhere classified (R26.2) Pain - part of body:  (abdomen)    Time: 6767-2094 PT Time Calculation (min) (ACUTE ONLY): 18 min   Charges:   PT Evaluation $PT Eval Low Complexity: 1 Low          Lillia Pauls, PT, DPT Acute Rehabilitation Services Pager [phone] Office [phone]   Norval Morton 11/20/2020, 4:11 PM

## 2020-11-21 MED ORDER — ONDANSETRON 4 MG PO TBDP: 4.0000 mg | ORAL_TABLET | Freq: Three times a day (TID) | ORAL | 0 refills | Status: AC | PRN

## 2020-11-21 MED ORDER — PANTOPRAZOLE SODIUM 40 MG PO TBEC: 40.0000 mg | DELAYED_RELEASE_TABLET | Freq: Every day | ORAL | 1 refills | Status: AC

## 2020-11-21 NOTE — Discharge Summary (Signed)
Physician Discharge Summary  Patient ID: Erica Sanders MRN: [id number] DOB/AGE: [id number] y.o.  Admit date: 11/16/2020 Discharge date: 11/21/2020  Admission Diagnoses: Large hiatal hernia  Discharge Diagnoses:  Active Problems:   S/P gastrostomy tube (G tube) placement, follow-up exam   Discharged Condition: good  Hospital Course: Patient is a 80 year old female who underwent gastrostomy tube placement and laparoscopic gastropexy secondary to large hiatal hernia. Patient postoperatively was sent to the floor.  Patient had significant nausea vomiting postoperatively.  This required IV nausea medication.  Patient otherwise was ambulating well on her own.  Was tolerating soft diet prior to discharge.  Patient did have no pain complaints.  Patient was otherwise afebrile, deemed stable for discharge and discharged home.  Consults: None  Significant Diagnostic Studies: None  Treatments: surgery: As above  Discharge Exam: Blood pressure 137/68, pulse 79, temperature 97.8 F (36.6 C), temperature source Oral, resp. rate 17, height 5\' 5"  (1.651 m), weight 84.8 kg, SpO2 93 %. General appearance: alert and cooperative GI: soft, non-tender; bowel sounds normal; no masses,  no organomegaly and Incisions clean dry and intact, NG tube in place  Disposition: Discharge disposition: 01-Home or Self Care       Discharge Instructions    Diet - low sodium heart healthy   Complete by: As directed    Increase activity slowly   Complete by: As directed    Increase activity slowly   Complete by: As directed      Allergies as of 11/21/2020      Reactions   Naprosyn [naproxen] Anaphylaxis, Shortness Of Breath, Swelling   Piroxicam Itching, Hives   Nsaids Other (See Comments)   "swells"   Valium [diazepam] Other (See Comments)   "makes me Hyper"   Clarithromycin Nausea And Vomiting   Other reaction(s): GI Upset (intolerance)   Codeine Nausea And Vomiting   Meperidine Hcl Nausea Only    Morphine And Related Other (See Comments)   "Makes me feel stupid"      Medication List    TAKE these medications   acetaminophen 500 MG tablet Commonly known as: TYLENOL Take 1,000 mg by mouth every 6 (six) hours as needed for mild pain or moderate pain.   albuterol 108 (90 Base) MCG/ACT inhaler Commonly known as: VENTOLIN HFA Inhale 2 puffs into the lungs every 6 (six) hours as needed for wheezing or shortness of breath.   ALPRAZolam 0.25 MG tablet Commonly known as: XANAX Take 0.25 mg by mouth daily.   BIOTIN PO Take 1 tablet by mouth daily.   cetirizine 10 MG tablet Commonly known as: ZYRTEC Take 10 mg by mouth daily.   diclofenac Sodium 1 % Gel Commonly known as: VOLTAREN Apply 1 application topically daily as needed for pain.   famotidine 40 MG tablet Commonly known as: PEPCID Take 40 mg by mouth at bedtime.   FLUoxetine 20 MG capsule Commonly known as: PROZAC Take 20 mg by mouth daily as needed (Nerves).   fluticasone 50 MCG/ACT nasal spray Commonly known as: FLONASE Place 2 sprays into both nostrils daily as needed for allergies.   lisinopril 20 MG tablet Commonly known as: ZESTRIL Take 20 mg by mouth daily.   metoprolol succinate 25 MG 24 hr tablet Commonly known as: TOPROL-XL Take 25 mg by mouth daily.   ondansetron 4 MG disintegrating tablet Commonly known as: Zofran ODT Take 1 tablet (4 mg total) by mouth every 8 (eight) hours as needed for nausea or vomiting.   pantoprazole 40 MG  tablet Commonly known as: Protonix Take 1 tablet (40 mg total) by mouth daily.   polyethylene glycol 17 g packet Commonly known as: MIRALAX / GLYCOLAX Take 17 g by mouth daily.   PREVAGEN PO Take 1 tablet by mouth daily.   sulfamethoxazole-trimethoprim 800-160 MG tablet Commonly known as: BACTRIM DS Take 1 tablet by mouth 2 (two) times daily.   traMADol 50 MG tablet Commonly known as: Ultram Take 1 tablet (50 mg total) by mouth every 6 (six) hours as  needed.   Vitamin D3 50 MCG (2000 UT) capsule Take 2,000 Units by mouth daily.       Follow-up Information    Axel Filler, MD. Schedule an appointment as soon as possible for a visit in 2 weeks.   Specialty: General Surgery Why: Post op visit Contact information: [address] [place] [address] [phone]               Signed: Axel Filler 11/21/2020, 8:43 AM

## 2020-11-21 NOTE — Progress Notes (Signed)
Pt was hooked back up to gravity bag with 500 ml output. Pt still nauseous.  Pt only took a couple of bites of jello for dinner tonight.

## 2020-11-21 NOTE — Care Management Important Message (Signed)
Important Message  Patient Details  Name: Erica Sanders MRN: [id number] Date of Birth: 1940/08/19   Medicare Important Message Given:  Yes  Patient left prior to IM delivery document mailed to the patient home address.   Kseniya Grunden 11/21/2020, 3:54 PM

## 2020-11-21 NOTE — TOC Initial Note (Addendum)
Transition of Care [facility]) - Initial/Assessment Note    Patient Details  Name: Erica Sanders MRN: [id number] Date of Birth: [birth year]  Transition [facility]) CM/SW Contact:    Kingsley Plan, RN Phone Number: 11/21/2020, 10:05 AM  Clinical Narrative:                 Patient from home alone, however, daughter lives across the street.   Discussed Home health with patient patient in agreement .   Offered choice. [facility] , Elnita Maxwell with [facility] accepted referral.  Ordered cane, [facility] does not have any, advised for family to stop by a retail store and purchase    PCP Manson Passey  Patient aware  Expected Discharge Plan: Home w Home Health Services     Patient Goals and CMS Choice Patient states their goals for this hospitalization and ongoing recovery are:: to go home CMS Medicare.gov Compare Post Acute Care list provided to:: Patient Choice offered to / list presented to : Patient  Expected Discharge Plan and Services Expected Discharge Plan: Home w Home Health Services   Discharge Planning Services: CM Consult Post Acute Care Choice: Home Health,Durable Medical Equipment Living arrangements for the past 2 months: Single Family Home Expected Discharge Date: 11/21/20               DME Arranged: Gilmer Mor DME Agency: [facility] Date DME Agency Contacted: 11/21/20 Time DME Agency Contacted: 1001   HH Arranged: PT,RN          Prior Living Arrangements/Services Living arrangements for the past 2 months: Single Family Home Lives with:: Self Patient language and need for interpreter reviewed:: Yes Do you feel safe going back to the place where you live?: Yes      Need for Family Participation in Patient Care: Yes (Comment) Care giver support system in place?: Yes (comment) Current home services: DME Criminal Activity/Legal Involvement Pertinent to Current Situation/Hospitalization: No - Comment as needed  Activities of Daily Living      Permission  Sought/Granted   Permission granted to share information with : No              Emotional Assessment Appearance:: Appears stated age Attitude/Demeanor/Rapport: Engaged Affect (typically observed): Accepting Orientation: : Oriented to Self,Oriented to Place,Oriented to  Time,Oriented to Situation Alcohol / Substance Use: Not Applicable Psych Involvement: No (comment)  Admission diagnosis:  S/P gastrostomy tube (G tube) placement, follow-up exam [Z09] Patient Active Problem List   Diagnosis Date Noted  . S/P gastrostomy tube (G tube) placement, follow-up exam 11/16/2020  . 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

## 2020-11-21 NOTE — Care Management Important Message (Signed)
Important Message  Patient Details  Name: Erica Sanders MRN: [id number] Date of Birth: 1940-06-24   Medicare Important Message Given:  Yes     Quillan Whitter Stefan Church 11/21/2020, 4:39 PM

## 2020-11-23 ENCOUNTER — Other Ambulatory Visit: Payer: Self-pay

## 2020-11-23 ENCOUNTER — Emergency Department (HOSPITAL_COMMUNITY): Payer: Medicare Other

## 2020-11-23 ENCOUNTER — Encounter (HOSPITAL_COMMUNITY): Payer: Self-pay

## 2020-11-23 ENCOUNTER — Emergency Department (HOSPITAL_COMMUNITY)
Admission: EM | Admit: 2020-11-23 | Discharge: 2020-11-23 | Disposition: A | Discharge status: Home / Self Care | Payer: Medicare Other | Attending: Emergency Medicine | Admitting: Emergency Medicine

## 2020-11-23 DIAGNOSIS — K9423 Gastrostomy malfunction: Secondary | ICD-10-CM | POA: Diagnosis present

## 2020-11-23 DIAGNOSIS — I10 Essential (primary) hypertension: Secondary | ICD-10-CM | POA: Diagnosis not present

## 2020-11-23 DIAGNOSIS — K9420 Gastrostomy complication, unspecified: Secondary | ICD-10-CM

## 2020-11-23 DIAGNOSIS — J449 Chronic obstructive pulmonary disease, unspecified: Secondary | ICD-10-CM | POA: Insufficient documentation

## 2020-11-23 DIAGNOSIS — Z79899 Other long term (current) drug therapy: Secondary | ICD-10-CM | POA: Insufficient documentation

## 2020-11-23 LAB — URINALYSIS, ROUTINE W REFLEX MICROSCOPIC
Glucose, UA: NEGATIVE mg/dL
Hgb urine dipstick: NEGATIVE
Ketones, ur: 20 mg/dL — AB
Leukocytes,Ua: NEGATIVE
Nitrite: NEGATIVE
Protein, ur: NEGATIVE mg/dL
Specific Gravity, Urine: 1.02 (ref 1.005–1.030)
pH: 6 (ref 5.0–8.0)

## 2020-11-23 LAB — COMPREHENSIVE METABOLIC PANEL
ALT: 57 U/L — ABNORMAL HIGH (ref 0–44)
AST: 39 U/L (ref 15–41)
Albumin: 3.3 g/dL — ABNORMAL LOW (ref 3.5–5.0)
Alkaline Phosphatase: 56 U/L (ref 38–126)
Anion gap: 11 (ref 5–15)
BUN: 17 mg/dL (ref 8–23)
CO2: 33 mmol/L — ABNORMAL HIGH (ref 22–32)
Calcium: 9.2 mg/dL (ref 8.9–10.3)
Chloride: 96 mmol/L — ABNORMAL LOW (ref 98–111)
Creatinine, Ser: 0.97 mg/dL (ref 0.44–1.00)
GFR, Estimated: 59 mL/min — ABNORMAL LOW (ref >60)
Glucose, Bld: 124 mg/dL — ABNORMAL HIGH (ref 70–99)
Potassium: 3.4 mmol/L — ABNORMAL LOW (ref 3.5–5.1)
Sodium: 140 mmol/L (ref 135–145)
Total Bilirubin: 3 mg/dL — ABNORMAL HIGH (ref 0.3–1.2)
Total Protein: 6.3 g/dL — ABNORMAL LOW (ref 6.5–8.1)

## 2020-11-23 LAB — CBC WITH DIFFERENTIAL/PLATELET
Abs Immature Granulocytes: 0.05 10*3/uL (ref 0.00–0.07)
Basophils Absolute: 0 10*3/uL (ref 0.0–0.1)
Basophils Relative: 0 %
Eosinophils Absolute: 0.1 10*3/uL (ref 0.0–0.5)
Eosinophils Relative: 1 %
HCT: 46.6 % — ABNORMAL HIGH (ref 36.0–46.0)
Hemoglobin: 14.8 g/dL (ref 12.0–15.0)
Immature Granulocytes: 0 %
Lymphocytes Relative: 15 %
Lymphs Abs: 2.1 10*3/uL (ref 0.7–4.0)
MCH: 28.2 pg (ref 26.0–34.0)
MCHC: 31.8 g/dL (ref 30.0–36.0)
MCV: 88.9 fL (ref 80.0–100.0)
Monocytes Absolute: 1.7 10*3/uL — ABNORMAL HIGH (ref 0.1–1.0)
Monocytes Relative: 12 %
Neutro Abs: 10.2 10*3/uL — ABNORMAL HIGH (ref 1.7–7.7)
Neutrophils Relative %: 72 %
Platelets: 357 10*3/uL (ref 150–400)
RBC: 5.24 MIL/uL — ABNORMAL HIGH (ref 3.87–5.11)
RDW: 14.3 % (ref 11.5–15.5)
WBC: 14.1 10*3/uL — ABNORMAL HIGH (ref 4.0–10.5)
nRBC: 0 % (ref 0.0–0.2)

## 2020-11-23 LAB — LIPASE, BLOOD: Lipase: 23 U/L (ref 11–51)

## 2020-11-23 MED ORDER — DIATRIZOATE MEGLUMINE & SODIUM 66-10 % PO SOLN
ORAL | Status: AC
  Administered 2020-11-23: 30 mL
  Filled 2020-11-23: qty 30

## 2020-11-23 MED ORDER — DIATRIZOATE MEGLUMINE & SODIUM 66-10 % PO SOLN
30.0000 mL | Freq: Once | ORAL | Status: DC
  Filled 2020-11-23: qty 30

## 2020-11-23 NOTE — ED Triage Notes (Signed)
Patient here with hiatal hernia surgery on 6/1. States that she was sent by her surgeon for bleeding from wound. Reports this am the blood ran down her legs. Alert and complains of soreness to incision

## 2020-11-23 NOTE — Progress Notes (Signed)
   Progress Note     Subjective: CC: recently discharge from hospital on 6/6 after admission following laparoscopic gastropexy and G tube placement by Dr. Derrell Lolling on 6/1. She reports to the ED due to ongoing leaking around her G-tube and resulting skin irritation. She has had some nausea and low appetite at home. No emesis. No fever or chills. She has been ambulating. Last BM yesterday.  Daughter is bedside.  Objective: Vital signs in last 24 hours: Temp:  [98.4 F (36.9 C)] 98.4 F (36.9 C) (06/08 1525) Pulse Rate:  [71-78] 73 (06/08 1636) Resp:  [13-17] 16 (06/08 1636) BP: (150)/(61) 150/61 (06/08 1525) SpO2:  [94 %-97 %] 94 % (06/08 1636)    Intake/Output from previous day: No intake/output data recorded. Intake/Output this shift: No intake/output data recorded.  PE: General: pleasant, WD, female who is laying in bed in NAD HEENT: head is normocephalic, atraumatic.  Sclera are noninjected.  Pupils equal and round.  Ears and nose without any masses or lesions.  Mouth is pink and moist Heart: regular, rate, and rhythm. Palpable radial and pedal pulses bilaterally Lungs: Respiratory effort nonlabored Abd: soft, ND, minimally TTP diffusely and severe TTP at G tube site mostly secondary to skin soreness. No palpable fluid collection at site. Site of G tube leaking apparent green gastric contents and with some bright red blood. Surrounding skin is erythematous MS: all 4 extremities are symmetrical with no cyanosis, clubbing, or edema. Skin: warm and dry Psych: A&Ox3 with an appropriate affect.       Lab Results:  Recent Labs    11/23/20 1410  WBC 14.1*  HGB 14.8  HCT 46.6*  PLT 357   BMET Recent Labs    11/23/20 1410  NA 140  K 3.4*  CL 96*  CO2 33*  GLUCOSE 124*  BUN 17  CREATININE 0.97  CALCIUM 9.2   PT/INR No results for input(s): LABPROT, INR in the last 72 hours. CMP     Component Value Date/Time   NA 140 11/23/2020 1410   K 3.4 (L) 11/23/2020 1410    CL 96 (L) 11/23/2020 1410   CO2 33 (H) 11/23/2020 1410   GLUCOSE 124 (H) 11/23/2020 1410   BUN 17 11/23/2020 1410   CREATININE 0.97 11/23/2020 1410   CALCIUM 9.2 11/23/2020 1410   PROT 6.3 (L) 11/23/2020 1410   ALBUMIN 3.3 (L) 11/23/2020 1410   AST 39 11/23/2020 1410   ALT 57 (H) 11/23/2020 1410   ALKPHOS 56 11/23/2020 1410   BILITOT 3.0 (H) 11/23/2020 1410   GFRNONAA 59 (L) 11/23/2020 1410   GFRAA >60 06/25/2018 1145   Lipase     Component Value Date/Time   LIPASE 23 11/23/2020 1410       Studies/Results: No results found.  Anti-infectives: Anti-infectives (From admission, onward)   None       Assessment/Plan Leaking G-tub S/p gastrostomy tube placement and laparoscopic gastropexy secondary to large hiatal hernia on 6/1 with Dr. Derrell Lolling - POD7 - G-tube balloon deflated and re-inflated with 20 cc saline. Balloon adjusted to fit more snugly to abdominal wall - significant skin irration related to acidic gastric contents - recommend skin barrier with dressing and frequent dressing changes - will consult WOC for further recommendations on creams/barriers - await G tube study  Remain NPO for now   LOS: 0 days    Eric Form, [facility] 11/23/2020, 4:54 PM Please see Amion for pager number during day hours 7:00am-4:30pm

## 2020-11-23 NOTE — ED Notes (Signed)
PEG tube stopper advanced.

## 2020-11-23 NOTE — ED Provider Notes (Addendum)
[facility]   CSN: [id number] Arrival date & time: 11/23/20  1316     History No chief complaint on file.   Erica SalmonsKatherine Sanders is a 80 y.o. female.  80 year old female with past medical history below including recent hiatal hernia repair who presents with bleeding from G-tube.  Patient had hiatal hernia repair and G-tube placement on 11/16/2020.  When she went home, she noted that one of the ports from the G-tube did not have a cap on the end and every time she stands up gastric material pours out of the tube.  She has continued to have dark brown and bloody drainage from around the tube and reports of burning pain around the tube from skin irritation.  She has been trying to apply a barrier cream without improvement.  No fevers or vomiting.  She denies abdominal pain distant from site of G-tube.  No anticoagulant use.  The history is provided by the patient.       Past Medical History:  Diagnosis Date  . Bronchitis    chronic  . Complication of anesthesia    "very slow to wake up."  . COPD (chronic obstructive pulmonary disease) (HCC)   . DDD (degenerative disc disease), lumbar   . Depression   . Dyspnea   . 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  . Family history of adverse reaction to anesthesia    Daughter to wake up, nausea  . Fibromyalgia   . History of kidney stones    passed  . Hypertension   . Osteoarthritis   . Panic attacks   . Pneumonia   . PONV (postoperative nausea and vomiting)   . Spinal stenosis   . Vitamin D deficiency     Patient Active Problem List   Diagnosis Date Noted  . S/P gastrostomy tube (G tube) placement, follow-up exam 11/16/2020  . 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 ABDOMINAL HYSTERECTOMY  1978  . ANKLE FRACTURE SURGERY Left 2003  .  APPENDECTOMY    . COLONOSCOPY    . KNEE SURGERY Left 1980  . LAPAROSCOPIC INSERTION GASTROSTOMY TUBE N/A 11/16/2020   Procedure: LAPAROSCOPIC GASTROPEXY AND INSERTION GASTROSTOMY TUBE;  Surgeon: Axel Filleramirez, Armando, MD;  Location: [facility] OR;  Service: General;  Laterality: N/A;  . LUMBAR FUSION  1975  . LUMBAR LAMINECTOMY/DECOMPRESSION MICRODISCECTOMY N/A 06/25/2018   Procedure: LAMINECTOMY AND FORAMINOTOMY LUMBAR TWO- LUMBAR THREE, LUMBAR FOUR;  Surgeon: Tressie StalkerJenkins, Jeffrey, MD;  [facility];  Service: Neurosurgery;  Laterality: N/A;  LAMINECTOMY AND FORAMINOTOMY LUMBAR 2- LUMBAR 3, LUMBAR 3- LUMBAR 4  . MULTIPLE TOOTH EXTRACTIONS       OB History   No obstetric history on file.     No family history on file.  Social History   Tobacco Use  . Smoking status: Never Smoker  . Smokeless tobacco: Never Used  Vaping Use  . Vaping Use: Never used  Substance Use Topics  . Alcohol use: Never  . Drug use: Never    Home Medications Prior to Admission medications   Medication Sig Start Date End Date Taking? Authorizing Provider  acetaminophen (TYLENOL) 500 MG tablet Take 1,000 mg by mouth every 6 (six) hours as needed for mild pain or moderate pain.    [provider]  albuterol (PROVENTIL HFA;VENTOLIN HFA) 108 (90 Base) MCG/ACT inhaler Inhale 2 puffs  into the lungs every 6 (six) hours as needed for wheezing or shortness of breath.    [provider]  ALPRAZolam Prudy Feeler) 0.25 MG tablet Take 0.25 mg by mouth daily. 10/10/20   [provider]  Apoaequorin (PREVAGEN PO) Take 1 tablet by mouth daily.    [provider]  BIOTIN PO Take 1 tablet by mouth daily.    [provider]  cetirizine (ZYRTEC) 10 MG tablet Take 10 mg by mouth daily.    [provider]  Cholecalciferol (VITAMIN D3) 50 MCG (2000 UT) capsule Take 2,000 Units by mouth daily.    [provider]  diclofenac Sodium (VOLTAREN) 1 % GEL Apply 1 application topically daily as  needed for pain. 09/12/20   [provider]  famotidine (PEPCID) 40 MG tablet Take 40 mg by mouth at bedtime. 09/18/20   [provider]  FLUoxetine (PROZAC) 20 MG capsule Take 20 mg by mouth daily as needed (Nerves).    [provider]  fluticasone (FLONASE) 50 MCG/ACT nasal spray Place 2 sprays into both nostrils daily as needed for allergies. 10/17/20   [provider]  lisinopril (ZESTRIL) 20 MG tablet Take 20 mg by mouth daily. 10/31/20   [provider]  metoprolol succinate (TOPROL-XL) 25 MG 24 hr tablet Take 25 mg by mouth daily. 11/03/20   [provider]  ondansetron (ZOFRAN ODT) 4 MG disintegrating tablet Take 1 tablet (4 mg total) by mouth every 8 (eight) hours as needed for nausea or vomiting. 11/21/20   Axel Filler, MD  pantoprazole (PROTONIX) 40 MG tablet Take 1 tablet (40 mg total) by mouth daily. 11/21/20 11/21/21  Axel Filler, MD  polyethylene glycol [facility] / Ethelene Hal) packet Take 17 g by mouth daily.    [provider]  sulfamethoxazole-trimethoprim (BACTRIM DS) 800-160 MG tablet Take 1 tablet by mouth 2 (two) times daily. 11/07/20   [provider]  traMADol (ULTRAM) 50 MG tablet Take 1 tablet (50 mg total) by mouth every 6 (six) hours as needed. 11/16/20 11/16/21  Axel Filler, MD    Allergies    Naprosyn [naproxen], Piroxicam, Nsaids, Valium [diazepam], Clarithromycin, Codeine, Meperidine hcl, and Morphine and related  Review of Systems   Review of Systems All other systems reviewed and are negative except that which was mentioned in HPI  Physical Exam Updated Vital Signs BP (!) 144/51   Pulse 75   Temp 98.4 F (36.9 C)   Resp 16   SpO2 97%   Physical Exam Vitals and nursing note reviewed. Exam conducted with a chaperone present.  Constitutional:      General: She is not in acute distress.    Appearance: Normal appearance.  HENT:     Head: Normocephalic and atraumatic.  Eyes:      Conjunctiva/sclera: Conjunctivae normal.  Cardiovascular:     Rate and Rhythm: Normal rate and regular rhythm.     Heart sounds: Normal heart sounds. No murmur heard.   Pulmonary:     Effort: Pulmonary effort is normal.     Breath sounds: Normal breath sounds.  Abdominal:     General: Abdomen is flat. Bowel sounds are normal. There is no distension.     Palpations: Abdomen is soft.     Tenderness: There is abdominal tenderness.     Comments: g-tube in place w/ clamp on central port; brown discharge and bright red blood leaking from around insertion site; significant skin irritation and superficial breakdown surrounding g-tube site corresponding to areas  of bandage taping; tenderness immediately surrounding g-tube, other areas of abd soft and non-tender  Musculoskeletal:     Right lower leg: No edema.     Left lower leg: No edema.  Skin:    General: Skin is warm and dry.  Neurological:     Mental Status: She is alert and oriented to person, place, and time.     Comments: fluent  Psychiatric:        Mood and Affect: Mood normal.        Behavior: Behavior normal.     ED Results / Procedures / Treatments   Labs (all labs ordered are listed, but only abnormal results are displayed) Labs Reviewed  COMPREHENSIVE METABOLIC PANEL - Abnormal; Notable for the following components:      Result Value   Potassium 3.4 (*)    Chloride 96 (*)    CO2 33 (*)    Glucose, Bld 124 (*)    Total Protein 6.3 (*)    Albumin 3.3 (*)    ALT 57 (*)    Total Bilirubin 3.0 (*)    GFR, Estimated 59 (*)    All other components within normal limits  CBC WITH DIFFERENTIAL/PLATELET - Abnormal; Notable for the following components:   WBC 14.1 (*)    RBC 5.24 (*)    HCT 46.6 (*)    Neutro Abs 10.2 (*)    Monocytes Absolute 1.7 (*)    All other components within normal limits  URINALYSIS, ROUTINE W REFLEX MICROSCOPIC - Abnormal; Notable for the following components:   Color, Urine AMBER (*)     APPearance HAZY (*)    Bilirubin Urine SMALL (*)    Ketones, ur 20 (*)    All other components within normal limits  LIPASE, BLOOD    EKG None  Radiology DG ABDOMEN PEG TUBE LOCATION  Result Date: 11/23/2020 CLINICAL DATA:  PEG tube complications. EXAM: ABDOMEN - 1 VIEW COMPARISON:  November 19, 2020 FINDINGS: Limited view of the abdomen obtained after administration of oral contrast through pre-existing PEG tube demonstrates extravasation of small amount of contrast on the PEG tube proximal to the balloon IMPRESSION: Small amount of extravasation of contrast along the PEG tube proximal to the level of the balloon. Electronically Signed   By: Ted Mcalpine M.D.   On: 11/23/2020 17:57    Procedures Procedures   Medications Ordered in ED Medications  diatrizoate meglumine-sodium (GASTROGRAFIN) 66-10 % solution 30 mL (has no administration in time range)  diatrizoate meglumine-sodium (GASTROGRAFIN) 66-10 % solution (30 mLs  Given 11/23/20 1714)    ED Course  I have reviewed the triage vital signs and the nursing notes.  Pertinent labs & imaging results that were available during my care of the patient were reviewed by me and considered in my medical decision making (see chart for details).    MDM Rules/Calculators/A&P                          Patient with some dark blood as well as a small amount of bright red blood leaking from around the tube, no rapid blood loss.  Screening lab work sent from triage is reassuring with stable hemoglobin.  WBC 14,000 which is downtrending from previous.  Consulted general surgery, discussed with Dr. Derrell Lolling who evaluated the patient in the ED.  Obtained x-ray with contrast to confirm appropriate placement.  He cinched down the tube and it is no longer leaking.  Also  obtained cap the port that was leaking.  He will set her up for ostomy clinic for some skin care around to the PEG tube.   After patient was discharged and stood up, cap fell out again.   Contacted general surgeon on-call Dr. Freida Busman, who was able to tape the cap and provided with an extra In case this one falls out again.  Then began leaking from around the tube again, fluid is nonbloody.  Discussed again with Dr. Freida Busman, who states that PEG is to new to be replaced and patient will just need to manage leaking at home until follow-up with Dr. Derrell Lolling.  She will be set up for ostomy care clinic for skin care around the PEG tube. Final Clinical Impression(s) / ED Diagnoses Final diagnoses:  Leaking PEG tube [facility])    Rx / DC Orders ED Discharge Orders    None       Clora Ohmer, Ambrose Finland, MD 11/23/20 1855    Clarene Duke Ambrose Finland, MD 11/23/20 2025

## 2020-11-23 NOTE — ED Provider Notes (Signed)
Emergency Medicine Provider Triage Evaluation Note  Erica Sanders , a 80 y.o. female  was evaluated in triage.  Pt complains of drainage from around her gtube. He had a gastropexy with gtube placement earlier this week. Denies fevers  Review of Systems  Positive: abd pain, drainage from wound Negative: fever  Physical Exam  There were no vitals taken for this visit. Gen:   Awake, no distress   Resp:  Normal effort  MSK:   Moves extremities without difficulty  Other:  gtube in place with clamp present. Dark material noted gtube.  Medical Decision Making  Medically screening exam initiated at 2:06 PM.  Appropriate orders placed.  Erica Sanders was informed that the remainder of the evaluation will be completed by another provider, this initial triage assessment does not replace that evaluation, and the importance of remaining in the ED until their evaluation is complete.     Karrie Meres, PA-C 11/23/20 1409    Cathren Laine, MD 11/25/20 [phone]

## 2020-11-24 NOTE — Consult Note (Signed)
Patient was referred to outpatient ostomy clinic by Dr Derrell Lolling.  I called and spoke with Velna Hatchet, daughter and gave her information on the clinic. She would like to talk to her mother and will call office if appointment is needed. They live over an hour away.  I emailed patient information to [email or web address]  Maple Hudson MSN, RN, FNP-BC CWON Wound, Ostomy, Continence Nurse Pager [phone]

## 2022-08-31 IMAGING — DX DG ABDOMEN 1V
2 series · 2 of 2 positions shown · non-contrast
Comparison: November 19, 2020

CLINICAL DATA: PEG tube complications.

EXAM:
ABDOMEN - 1 VIEW

[abdomen kub (1 of 2)]
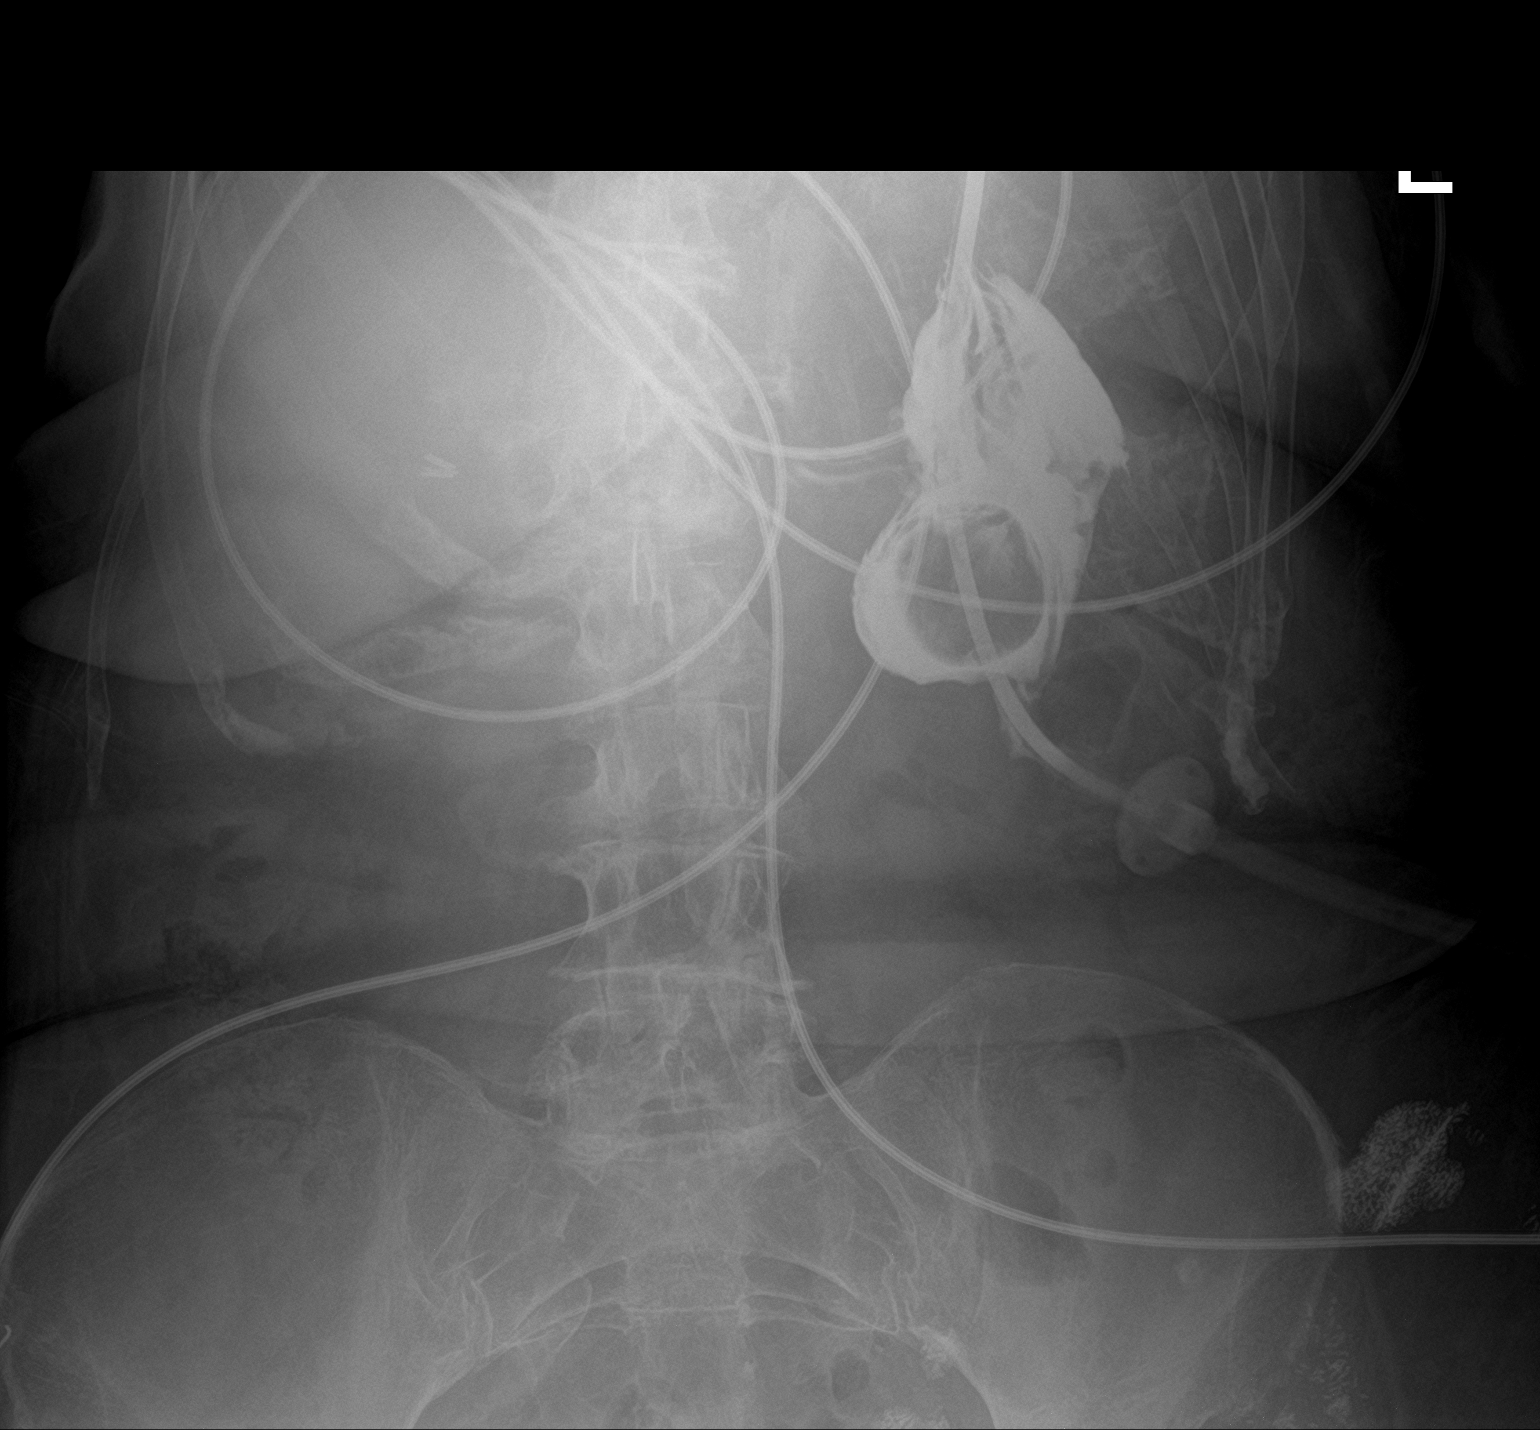

[abdomen kub (2 of 2)]
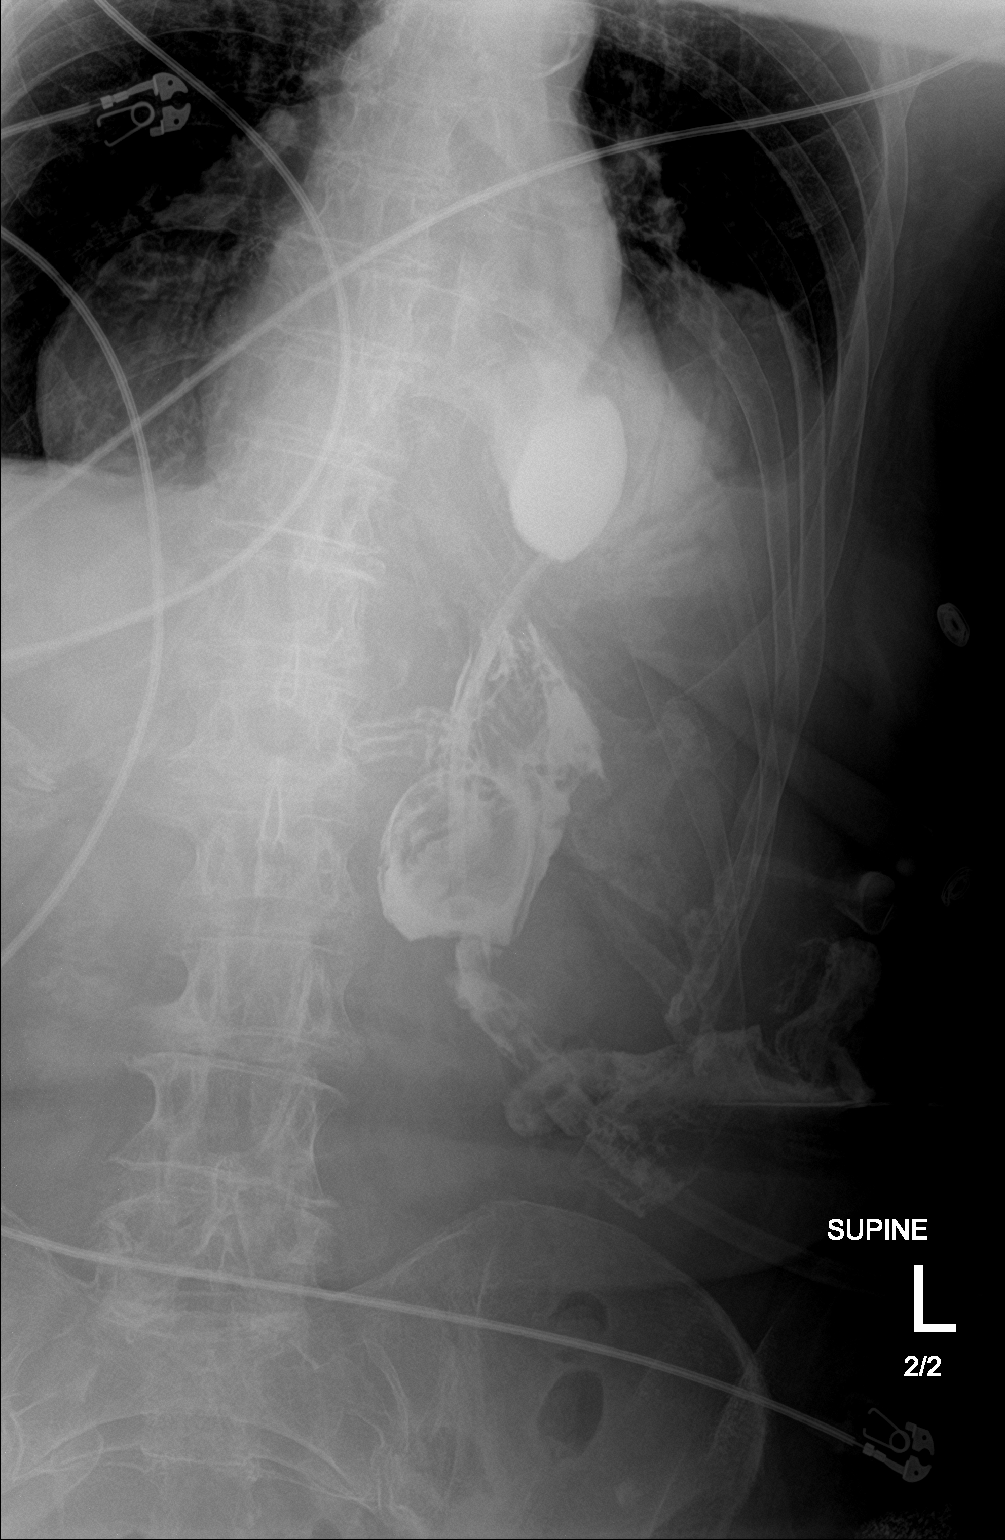

[2 of 2 positions shown; findings below may reference images not displayed]

FINDINGS: Limited view of the abdomen obtained after administration of oral
contrast through pre-existing PEG tube demonstrates extravasation of
small amount of contrast on the PEG tube proximal to the balloon
IMPRESSION: Small amount of extravasation of contrast along the PEG tube
proximal to the level of the balloon.
